# Patient Record
Sex: Female | Born: 1995
Health system: Southern US, Community
[De-identification: ages and names within clinical notes are randomized; demographics above are authoritative.]

## PROBLEM LIST (undated history)

## (undated) DIAGNOSIS — F419 Anxiety disorder, unspecified: Secondary | ICD-10-CM

## (undated) DIAGNOSIS — F32A Depression, unspecified: Secondary | ICD-10-CM

---
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
## 2016-01-28 ENCOUNTER — Encounter (HOSPITAL_COMMUNITY): Payer: Self-pay

## 2016-01-28 ENCOUNTER — Ambulatory Visit (HOSPITAL_COMMUNITY)
Admission: EM | Admit: 2016-01-28 | Discharge: 2016-01-28 | Disposition: A | Discharge status: Home / Self Care | Payer: No Typology Code available for payment source | Attending: Emergency Medicine | Admitting: Emergency Medicine

## 2016-01-28 DIAGNOSIS — L309 Dermatitis, unspecified: Secondary | ICD-10-CM

## 2016-01-28 MED ORDER — TRIAMCINOLONE ACETONIDE 0.1 % EX OINT: 1.0000 "application " | TOPICAL_OINTMENT | Freq: Two times a day (BID) | CUTANEOUS | Status: DC

## 2016-01-28 NOTE — ED Notes (Signed)
Patient presents with a rash on bilateral underarms and right corner of eye x1 month, pt has used Cortizone cream she says it cleared a little but has returned and she would like to have it examined  No acute distress

## 2016-01-28 NOTE — Discharge Instructions (Signed)
The rash is eczema. Use the triamcinolone ointment twice a day until it is resolved. Also apply the Aveeno lotion you bought at least twice a day.  For the spot by your eye, only use the lotion frequently. You can apply a tiny amount of over-the-counter hydrocortisone cream, but did not use anything stronger.  Follow-up as needed.

## 2016-01-28 NOTE — ED Provider Notes (Signed)
CSN: [id number]     Arrival date & time 01/28/16  1619 History   First MD Initiated Contact with Patient 01/28/16 1634     Chief Complaint  Patient presents with  . Rash   (Consider location/radiation/quality/duration/timing/severity/associated sxs/prior Treatment) HPI She is an 20 year old woman here for evaluation of rash. She states the rash is in her bilateral axilla. Is been present for about a month. It is dry and itchy. She has tried over-the-counter hydrocortisone cream which did provide some improvement. She also reports getting a spot by her right eye. She did just buy some Aveeno lotion.  History reviewed. No pertinent past medical history. History reviewed. No pertinent past surgical history. Family History  Problem Relation Age of Onset  . Diabetes Father   . Hypertension Father   . Hypertension Maternal Aunt   . Cancer Maternal Aunt   . Hypertension Maternal Uncle   . Diabetes Paternal Aunt   . Diabetes Paternal Uncle   . Hypertension Maternal Grandmother   . Cancer Maternal Grandmother   . Hypertension Paternal Grandmother   . Heart attack Neg Hx   . Hyperlipidemia Neg Hx   . Sudden death Neg Hx   . Asthma Mother    Social History  Substance Use Topics  . Smoking status: Never Smoker   . Smokeless tobacco: Never Used  . Alcohol Use: No   OB History    No data available     Review of Systems As in history of present illness Allergies  Review of patient's allergies indicates no known allergies.  Home Medications   Prior to Admission medications   Medication Sig Start Date End Date Taking? Authorizing Provider  triamcinolone ointment (KENALOG) 0.1 % Apply 1 application topically 2 (two) times daily. 01/28/16   Charm RingsErin J Ashling Roane, MD   Meds Ordered and Administered this Visit  Medications - No data to display  BP 105/71 mmHg  Pulse 81  Temp(Src) 98 F (36.7 C) (Oral)  Resp 14  SpO2 100%  LMP 01/03/2016 (Approximate) No data found.   Physical Exam   Constitutional: She is oriented to person, place, and time. She appears well-developed and well-nourished. No distress.  Cardiovascular: Normal rate.   Pulmonary/Chest: Effort normal.  Neurological: She is alert and oriented to person, place, and time.  Skin: Rash (she has a hyperpigmented scaly rash in the anterior axilla bilaterally. She also has some slight hyperpigmentation and thickening of the skin the inner right eye.) noted.    ED Course  Procedures (including critical care time)  Labs Review Labs Reviewed - No data to display  Imaging Review No results found.    MDM   1. Eczema    No central clearing to suggest fungal infection. Triamcinolone ointment for body lesions. Discussed avoiding steroid creams on the face due to skin sensitivity. Recommended frequent application of Aveeno lotion. Follow-up as needed.    Charm RingsErin J Angela Platner, MD 01/28/16 [phone]

## 2016-02-11 ENCOUNTER — Ambulatory Visit (INDEPENDENT_AMBULATORY_CARE_PROVIDER_SITE_OTHER): Payer: No Typology Code available for payment source | Admitting: *Deleted

## 2016-02-11 DIAGNOSIS — Z3042 Encounter for surveillance of injectable contraceptive: Secondary | ICD-10-CM | POA: Diagnosis not present

## 2016-02-11 MED ORDER — MEDROXYPROGESTERONE ACETATE 150 MG/ML IM SUSP
150.0000 mg | Freq: Once | INTRAMUSCULAR | Status: AC
  Administered 2016-02-11: 150 mg via INTRAMUSCULAR

## 2016-02-11 NOTE — Progress Notes (Signed)
Pt presents for depo injection. Pt within depo window, no urine hcg needed. Injection given, tolerated well. F/u depo injection visit scheduled.   

## 2016-03-22 ENCOUNTER — Encounter: Payer: Self-pay | Admitting: Pediatrics

## 2016-03-23 ENCOUNTER — Encounter: Payer: Self-pay | Admitting: Pediatrics

## 2016-04-28 ENCOUNTER — Ambulatory Visit (INDEPENDENT_AMBULATORY_CARE_PROVIDER_SITE_OTHER): Payer: No Typology Code available for payment source | Admitting: *Deleted

## 2016-04-28 DIAGNOSIS — Z3042 Encounter for surveillance of injectable contraceptive: Secondary | ICD-10-CM

## 2016-04-28 MED ORDER — MEDROXYPROGESTERONE ACETATE 150 MG/ML IM SUSP
150.0000 mg | Freq: Once | INTRAMUSCULAR | Status: AC
  Administered 2016-04-28: 150 mg via INTRAMUSCULAR

## 2016-04-28 NOTE — Progress Notes (Signed)
Pt presents for depo injection. Pt within depo window, no urine hcg needed. Injection given, tolerated well. F/u depo injection visit scheduled.   

## 2016-07-14 ENCOUNTER — Ambulatory Visit (INDEPENDENT_AMBULATORY_CARE_PROVIDER_SITE_OTHER): Payer: Medicaid Other | Admitting: *Deleted

## 2016-07-14 DIAGNOSIS — Z3049 Encounter for surveillance of other contraceptives: Secondary | ICD-10-CM

## 2016-07-14 DIAGNOSIS — Z3042 Encounter for surveillance of injectable contraceptive: Secondary | ICD-10-CM

## 2016-07-14 MED ORDER — MEDROXYPROGESTERONE ACETATE 150 MG/ML IM SUSP
150.0000 mg | Freq: Once | INTRAMUSCULAR | Status: AC
  Administered 2016-07-14: 150 mg via INTRAMUSCULAR

## 2016-07-14 NOTE — Progress Notes (Signed)
Pt presents for depo injection. Pt within depo window, no urine hcg needed. Injection given, tolerated well. F/u depo injection visit scheduled.   

## 2016-09-29 ENCOUNTER — Ambulatory Visit (INDEPENDENT_AMBULATORY_CARE_PROVIDER_SITE_OTHER): Payer: Medicaid Other | Admitting: *Deleted

## 2016-09-29 DIAGNOSIS — Z3042 Encounter for surveillance of injectable contraceptive: Secondary | ICD-10-CM | POA: Diagnosis not present

## 2016-09-29 MED ORDER — MEDROXYPROGESTERONE ACETATE 150 MG/ML IM SUSP
150.0000 mg | Freq: Once | INTRAMUSCULAR | Status: AC
  Administered 2016-09-29: 150 mg via INTRAMUSCULAR

## 2016-09-29 NOTE — Progress Notes (Addendum)
Pt presents for depo injection. Pt within depo window, no urine hcg needed. Injection given, tolerated well. F/u depo injection visit scheduled.   

## 2016-12-15 ENCOUNTER — Ambulatory Visit (INDEPENDENT_AMBULATORY_CARE_PROVIDER_SITE_OTHER): Payer: Medicaid Other | Admitting: *Deleted

## 2016-12-15 DIAGNOSIS — Z3042 Encounter for surveillance of injectable contraceptive: Secondary | ICD-10-CM

## 2016-12-15 MED ORDER — MEDROXYPROGESTERONE ACETATE 150 MG/ML IM SUSP
150.0000 mg | Freq: Once | INTRAMUSCULAR | Status: AC
  Administered 2016-12-15: 150 mg via INTRAMUSCULAR

## 2016-12-15 NOTE — Progress Notes (Signed)
Pt presents for depo injection. Pt within depo window, no urine hcg needed. Injection given, tolerated well. F/u depo injection visit sched.

## 2017-03-02 ENCOUNTER — Ambulatory Visit: Payer: Self-pay

## 2017-03-03 ENCOUNTER — Ambulatory Visit (HOSPITAL_COMMUNITY)
Admission: EM | Admit: 2017-03-03 | Discharge: 2017-03-03 | Disposition: A | Discharge status: Home / Self Care | Payer: BLUE CROSS/BLUE SHIELD | Attending: Family Medicine | Admitting: Family Medicine

## 2017-03-03 ENCOUNTER — Encounter (HOSPITAL_COMMUNITY): Payer: Self-pay | Admitting: Emergency Medicine

## 2017-03-03 DIAGNOSIS — R197 Diarrhea, unspecified: Secondary | ICD-10-CM | POA: Diagnosis not present

## 2017-03-03 DIAGNOSIS — R110 Nausea: Secondary | ICD-10-CM | POA: Diagnosis not present

## 2017-03-03 NOTE — Discharge Instructions (Signed)
No dairy products for the next few days. Follow the instructions given to you in reference to the symptoms. It appears that you are gradually getting better. Continue drinking the clear liquids to increase her strength. Avoid dairy products. For worsening, new symptoms or problems may return.

## 2017-03-03 NOTE — ED Triage Notes (Signed)
Onset Thursday night of diarrhea.  Denies vomiting at that time.  Diarrhea has continued.  Patient is not having that many stools, just that they are watery

## 2017-03-03 NOTE — ED Provider Notes (Signed)
CSN: [id number]     Arrival date & time 03/03/17  1706 History   First MD Initiated Contact with Patient 03/03/17 1854     Chief Complaint  Patient presents with  . Abdominal Pain   (Consider location/radiation/quality/duration/timing/severity/associated sxs/prior Treatment) 21 year old female accompanied by mother stating that 2 days ago she started feeling sickly with nausea and diarrhea. Yesterday she was feeling worse with several episodes of watery diarrhea, no blood and a "stomachache" across the mid abdomen and periumbilical area. She was feeling better yesterday afternoon with decrease and number of diarrhea stools. She able cereal with milk and she had another episode of diarrhea several hours later. She states she is feeling better now she is drinking Gatorade she has no vomiting. No abdominal pain at this time.      History reviewed. No pertinent past medical history. History reviewed. No pertinent surgical history. Family History  Problem Relation Age of Onset  . Diabetes Father   . Hypertension Father   . Asthma Mother   . Hypertension Maternal Aunt   . Cancer Maternal Aunt   . Hypertension Maternal Uncle   . Diabetes Paternal Aunt   . Diabetes Paternal Uncle   . Hypertension Maternal Grandmother   . Cancer Maternal Grandmother   . Hypertension Paternal Grandmother   . Heart attack Neg Hx   . Hyperlipidemia Neg Hx   . Sudden death Neg Hx    Social History  Substance Use Topics  . Smoking status: Never Smoker  . Smokeless tobacco: Never Used  . Alcohol use No   OB History    No data available     Review of Systems  Constitutional: Negative.   Respiratory: Negative.   Gastrointestinal: Positive for abdominal pain, diarrhea and nausea. Negative for abdominal distention, blood in stool, constipation and vomiting.  Genitourinary: Negative.   Skin: Negative.   Neurological: Negative.   All other systems reviewed and are negative.   Allergies  Patient has no  known allergies.  Home Medications   Prior to Admission medications   Medication Sig Start Date End Date Taking? Authorizing Provider  triamcinolone ointment (KENALOG) 0.1 % Apply 1 application topically 2 (two) times daily. 01/28/16   Charm RingsHonig, Erin J, MD   Meds Ordered and Administered this Visit  Medications - No data to display  BP 96/60 (BP Location: Right Arm)   Pulse 100   Temp 98.4 F (36.9 C) (Oral)   Resp 16   SpO2 100%  No data found.   Physical Exam  Constitutional: She is oriented to person, place, and time. She appears well-developed and well-nourished. No distress.  HENT:  Head: Normocephalic and atraumatic.  Neck: Neck supple.  Cardiovascular: Normal rate, regular rhythm and normal heart sounds.   Pulmonary/Chest: Breath sounds normal. No respiratory distress.  Abdominal: Soft. Bowel sounds are normal. She exhibits no distension and no mass. There is no tenderness. There is no rebound and no guarding.  Percussion is tympanic and on all quadrants. No distention, abdomen flat. No palpable masses.  Musculoskeletal: She exhibits no edema.  Neurological: She is alert and oriented to person, place, and time.  Skin: Skin is warm and dry.  Psychiatric: She has a normal mood and affect.  Nursing note and vitals reviewed.   Urgent Care Course     Procedures (including critical care time)  Labs Review Labs Reviewed - No data to display  Imaging Review No results found.   Visual Acuity Review  Right Eye Distance:  Left Eye Distance:   Bilateral Distance:    Right Eye Near:   Left Eye Near:    Bilateral Near:         MDM   1. Diarrhea, unspecified type   2. Nausea without vomiting    No dairy products for the next few days. Follow the instructions given to you in reference to the symptoms. It appears that you are gradually getting better. Continue drinking the clear liquids to increase her strength. Avoid dairy products. For worsening, new symptoms or  problems may return.     Hayden Rasmussen, NP 03/03/17 1914

## 2017-04-06 ENCOUNTER — Encounter: Payer: Self-pay | Admitting: Family

## 2017-04-06 ENCOUNTER — Ambulatory Visit (INDEPENDENT_AMBULATORY_CARE_PROVIDER_SITE_OTHER): Payer: Medicaid Other | Admitting: Family

## 2017-04-06 VITALS — BP 91/56 | HR 79 | Ht 60.24 in | Wt 101.8 lb

## 2017-04-06 DIAGNOSIS — Z3042 Encounter for surveillance of injectable contraceptive: Secondary | ICD-10-CM

## 2017-04-06 DIAGNOSIS — Z113 Encounter for screening for infections with a predominantly sexual mode of transmission: Secondary | ICD-10-CM | POA: Diagnosis not present

## 2017-04-06 DIAGNOSIS — Z3202 Encounter for pregnancy test, result negative: Secondary | ICD-10-CM

## 2017-04-06 LAB — POCT RAPID HIV: Rapid HIV, POC: NEGATIVE

## 2017-04-06 LAB — POCT URINE PREGNANCY: Preg Test, Ur: NEGATIVE

## 2017-04-06 MED ORDER — MEDROXYPROGESTERONE ACETATE 150 MG/ML IM SUSP
150.0000 mg | Freq: Once | INTRAMUSCULAR | Status: AC
  Administered 2017-04-06: 150 mg via INTRAMUSCULAR

## 2017-04-06 NOTE — Progress Notes (Signed)
THIS RECORD MAY CONTAIN CONFIDENTIAL INFORMATION THAT SHOULD NOT BE RELEASED WITHOUT REVIEW OF THE SERVICE PROVIDER.  Adolescent Medicine Consultation Follow-Up Visit Tamara Rodriguez  is a 21 y.o. female referred by No ref. provider found here today for follow-up regarding desire for Depo - outside of window.    Last seen in [facility] on 11/30/15 for chlamydia tx; depo as contraception.  Pertinent Labs? No Growth Chart Viewed? no   History was provided by the patient.  Interpreter? no  PCP Confirmed?  no  My Chart Activated?   no    Chief Complaint  Patient presents with  . Follow-up  . Contraception    HPI:    Was supposed to have depo in July.  Using condoms. Last unprotected sex over a year ago.  No vaginal discharge, lesions, pelvic pain, no painful intercourse.  Denies contraindications for progesterone or estrogen.  Reviewed [facility] options, patient desires to continue with Depo.   Review of Systems  Constitutional: Negative for malaise/fatigue.  Eyes: Negative for double vision.  Respiratory: Negative for shortness of breath.   Cardiovascular: Negative for chest pain and palpitations.  Gastrointestinal: Negative for abdominal pain, constipation, diarrhea, nausea and vomiting.  Genitourinary: Negative for dysuria.  Musculoskeletal: Negative for joint pain and myalgias.  Skin: Negative for rash.  Neurological: Negative for dizziness and headaches.  Endo/Heme/Allergies: Does not bruise/bleed easily.   Patient's last menstrual period was 04/06/2017. No Known Allergies Outpatient Medications Prior to Visit  Medication Sig Dispense Refill  . triamcinolone ointment (KENALOG) 0.1 % Apply 1 application topically 2 (two) times daily. (Patient not taking: Reported on 04/06/2017) 30 g 0   No facility-administered medications prior to visit.      Patient Active Problem List   Diagnosis Date Noted  . Chlamydia 11/29/2015  . Irregular menses 11/26/2015  .  Encounter for Depo-Provera contraception 11/26/2015  . Right ankle sprain 05/16/2011     Confidentiality was discussed with the patient and if applicable, with caregiver as well. The following portions of the patient's history were reviewed and updated as appropriate: allergies, current medications, past medical history and problem list.  Physical Exam:  Vitals:   04/06/17 1510  BP: (!) 91/56  Pulse: 79  Weight: 101 lb 12.8 oz (46.2 kg)  Height: 5' 0.24" (1.53 m)   BP (!) 91/56 (BP Location: Right Arm, Patient Position: Sitting, Cuff Size: Normal)   Pulse 79   Ht 5' 0.24" (1.53 m)   Wt 101 lb 12.8 oz (46.2 kg)   LMP 04/06/2017   BMI 19.73 kg/m  Body mass index: body mass index is 19.73 kg/m. Blood pressure percentiles are <1 % systolic and 15 % diastolic based on the August 2017 AAP Clinical Practice Guideline. Blood pressure percentile targets: 90: 124/76, 95: 128/79, 95 + 12 mmHg: 140/91.  Wt Readings from Last 3 Encounters:  04/06/17 101 lb 12.8 oz (46.2 kg) (5 %, Z= -1.66)*  12/10/15 87 lb 12.8 oz (39.8 kg) (<1 %, Z= -3.06)*  11/26/15 88 lb 3.2 oz (40 kg) (<1 %, Z= -3.00)*   * Growth percentiles are based on CDC 2-20 Years data.    Physical Exam  Constitutional: She is oriented to person, place, and time. She appears well-developed. No distress.  HENT:  Head: Normocephalic and atraumatic.  Eyes: Pupils are equal, round, and reactive to light. EOM are normal. No scleral icterus.  Neck: Normal range of motion. Neck supple. No thyromegaly present.  Cardiovascular: Normal rate, regular rhythm, normal  heart sounds and intact distal pulses.   No murmur heard. Pulmonary/Chest: Effort normal and breath sounds normal.  Abdominal: Soft.  Musculoskeletal: Normal range of motion. She exhibits no edema.  Lymphadenopathy:    She has no cervical adenopathy.  Neurological: She is alert and oriented to person, place, and time. No cranial nerve deficit.  Skin: Skin is warm and dry.  No rash noted.  Psychiatric: She has a normal mood and affect. Her behavior is normal. Judgment and thought content normal.    Assessment/Plan: 1. Encounter for Depo-Provera contraception -continue with depo after reviewing options Tier 1 and Tier 2, including nexplanon, iud, pill, patch and ring -condom use reviewed -return precautions  2. Routine screening for STI (sexually transmitted infection) -per protocol  - GC/Chlamydia Probe Amp - POCT Rapid HIV  3. Pregnancy examination or test, negative result Negative  - POCT urine pregnancy   Follow-up:  Return in about 3 months (around 07/07/2017), or in depo window, for RN - DEPO VISIT.   Medical decision-making:  >15 minutes spent face to face with patient with more than 50% of appointment spent discussing diagnosis, management, follow-up, and reviewing of contraceptive options, emergency contraception, condom use.

## 2017-04-07 LAB — GC/CHLAMYDIA PROBE AMP
CT PROBE, AMP APTIMA: DETECTED — AB
GC Probe RNA: NOT DETECTED

## 2017-04-10 ENCOUNTER — Encounter: Payer: Self-pay | Admitting: Pediatrics

## 2017-04-10 ENCOUNTER — Ambulatory Visit (INDEPENDENT_AMBULATORY_CARE_PROVIDER_SITE_OTHER): Payer: Medicaid Other | Admitting: Pediatrics

## 2017-04-10 VITALS — BP 93/58 | HR 82 | Ht 60.63 in | Wt 100.4 lb

## 2017-04-10 DIAGNOSIS — A749 Chlamydial infection, unspecified: Secondary | ICD-10-CM | POA: Diagnosis not present

## 2017-04-10 MED ORDER — AZITHROMYCIN 500 MG PO TABS
1000.0000 mg | ORAL_TABLET | Freq: Once | ORAL | Status: AC
Start: 2017-04-10 — End: 2017-04-10
  Administered 2017-04-10: 1000 mg via ORAL

## 2017-04-10 NOTE — Patient Instructions (Signed)
You may have Chlamydia even if you feel fine and have no symptoms.  What is Chlamydia?  Chlamydia is a sexually transmitted disease that can cause a bad infection in the female organs. The infection can cause fever, discharge and pain. It can also cause future tubal pregnancy or sterility in women. Men can develop pain, discharge, or more severe infections in the testes or scrotum.  It is recommended that you have an exam to diagnose Chlamydia and other possible STDs.  [facility].  Tell them you were told by a partner to call.  There is no charge for the exam and treatment.  FREE SCREENING FOR [place] RESIDENTS.  You may also take the treatment your partner gave you if you cannot come into the clinic.  It is possible that you have this infection and do not have symptoms. It is important that you be treated in order to prevent complications and to prevent spreading this infection to others.  Do not have sex for 7 days after treatment. It takes that long for the medication to work.  Tell all sex partners you have been with in the last 2 months to get checked for Chlamydia TREATMENT: Azithromycin 1 gram. Take the 2 pills at one time.  Do not share this medicine: the whole dose is needed to cure Chlamydia.  Do not take this medicine if you have been allergic to any antibiotic in the past.  Call the clinic if you have questions.  Do not take this medicine if you are having pain in your abdomen, pelvic area, groin area, or your testes - it may not work. Come to the clinic for a check-up to be sure you get the right treatment.  Possible side effects:  The antibiotic for your treatment has been carefully chosen to be safe and effective. However, any medication can have side effects. The most common side effect is upset stomach. Sometimes there is stomach cramping or diarrhea. Very rarely is there a rash, fever, or breathing problems related  to the medicine. If you have any of these symptoms you should call the clinic at [phone].  If your symptoms are severe - especially if it is hard to breathe - or it is after clinic hours, call 911.  DO NOT HAVE ANY KIND OF SEX (ORAL, ANAL, or VAGINAL) WITH OR WITHOUT CONDOMS FOR 7 DAYS after you take this medicine. After 7 days, use condoms. Condoms help protect against gonorrhea and other STDs.  Men and women who have Chlamydia should be re-tested in 3 to 4 months after treatment to be sure they have not been re-infected.

## 2017-04-10 NOTE — Progress Notes (Signed)
History was provided by the patient.  Tamara Rodriguez is a 21 y.o. female who is here for chlamydia treatment.   PCP confirmed? No.- needs to establish with adult care   Patient, No Pcp Per  HPI:  Has some vaginal discharge that is white but no other symptoms. She is not sexually active right now. She was last sexually active about a week ago with a new partner. They used a condom. She is willing to let him know but does not anticipate being with him again. No pain with sex.   Review of Systems  Constitutional: Negative for malaise/fatigue.  Eyes: Negative for double vision.  Respiratory: Negative for shortness of breath.   Cardiovascular: Negative for chest pain and palpitations.  Gastrointestinal: Negative for abdominal pain, constipation, diarrhea, nausea and vomiting.  Genitourinary: Negative for dysuria.  Musculoskeletal: Negative for joint pain and myalgias.  Skin: Negative for rash.  Neurological: Negative for dizziness and headaches.  Endo/Heme/Allergies: Does not bruise/bleed easily.     Patient Active Problem List   Diagnosis Date Noted  . Chlamydia 11/29/2015  . Irregular menses 11/26/2015  . Encounter for Depo-Provera contraception 11/26/2015  . Right ankle sprain 05/16/2011    Current Outpatient Prescriptions on File Prior to Visit  Medication Sig Dispense Refill  . triamcinolone ointment (KENALOG) 0.1 % Apply 1 application topically 2 (two) times daily. 30 g 0   No current facility-administered medications on file prior to visit.     No Known Allergies  Physical Exam:    Vitals:   04/10/17 0950  BP: (!) 93/58  Pulse: 82  Weight: 100 lb 6.4 oz (45.5 kg)  Height: 5' 0.63" (1.54 m)    Blood pressure percentiles are 2.1 % systolic and 23.4 % diastolic based on the August 2017 AAP Clinical Practice Guideline. Patient's last menstrual period was 04/06/2017.  Physical Exam  Constitutional: She appears well-developed. No distress.  HENT:  Mouth/Throat:  Oropharynx is clear and moist.  Neck: No thyromegaly present.  Cardiovascular: Normal rate and regular rhythm.   No murmur heard. Pulmonary/Chest: Breath sounds normal.  Abdominal: Soft. She exhibits no mass. There is tenderness in the right lower quadrant, suprapubic area and left lower quadrant. There is no guarding.  Musculoskeletal: She exhibits no edema.  Lymphadenopathy:    She has no cervical adenopathy.  Neurological: She is alert.  Skin: Skin is warm. No rash noted.  Psychiatric: She has a normal mood and affect.  Nursing note and vitals reviewed.    Assessment/Plan: 1. Chlamydia Treated today in clinic. Denied needing EPT as she doesn't expect to see that partner again. Discussed abstinence for 7 days and repeat of gc/ct wen she comes for depo shot in October. She was in agreement. Discussed return precautions.  - azithromycin (ZITHROMAX) tablet 1,000 mg; Take 2 tablets (1,000 mg total) by mouth once.

## 2017-04-23 ENCOUNTER — Ambulatory Visit: Payer: Medicaid Other | Admitting: Pediatrics

## 2017-06-22 ENCOUNTER — Ambulatory Visit (INDEPENDENT_AMBULATORY_CARE_PROVIDER_SITE_OTHER): Payer: Medicaid Other

## 2017-06-22 DIAGNOSIS — Z3042 Encounter for surveillance of injectable contraceptive: Secondary | ICD-10-CM | POA: Diagnosis not present

## 2017-06-22 DIAGNOSIS — Z113 Encounter for screening for infections with a predominantly sexual mode of transmission: Secondary | ICD-10-CM | POA: Diagnosis not present

## 2017-06-22 MED ORDER — MEDROXYPROGESTERONE ACETATE 150 MG/ML IM SUSP
150.0000 mg | Freq: Once | INTRAMUSCULAR | Status: AC
  Administered 2017-06-22: 150 mg via INTRAMUSCULAR

## 2017-06-22 NOTE — Progress Notes (Signed)
Last lmp 06/06/2017 Here for depo within window.  Tolerated well. Next appointment made.

## 2017-06-23 LAB — C. TRACHOMATIS/N. GONORRHOEAE RNA
C. TRACHOMATIS RNA, TMA: NOT DETECTED
N. gonorrhoeae RNA, TMA: NOT DETECTED

## 2017-09-07 ENCOUNTER — Ambulatory Visit (INDEPENDENT_AMBULATORY_CARE_PROVIDER_SITE_OTHER): Payer: BLUE CROSS/BLUE SHIELD

## 2017-09-07 DIAGNOSIS — Z113 Encounter for screening for infections with a predominantly sexual mode of transmission: Secondary | ICD-10-CM

## 2017-09-07 DIAGNOSIS — Z3042 Encounter for surveillance of injectable contraceptive: Secondary | ICD-10-CM | POA: Diagnosis not present

## 2017-09-07 MED ORDER — MEDROXYPROGESTERONE ACETATE 150 MG/ML IM SUSP
150.0000 mg | Freq: Once | INTRAMUSCULAR | Status: AC
  Administered 2017-09-07: 150 mg via INTRAMUSCULAR

## 2017-09-07 NOTE — Progress Notes (Signed)
Pt presents for depo injection. Pt within depo window, no urine hcg needed. Injection given, tolerated well. F/u depo injection visit scheduled. Due to patient hx of Chlamydia will re-screen today for STI. Pt aware. Correct phone number listed in Epic.

## 2017-09-11 ENCOUNTER — Ambulatory Visit (INDEPENDENT_AMBULATORY_CARE_PROVIDER_SITE_OTHER): Payer: BLUE CROSS/BLUE SHIELD | Admitting: Pediatrics

## 2017-09-11 VITALS — BP 88/58 | HR 82 | Ht 60.63 in | Wt 98.4 lb

## 2017-09-11 DIAGNOSIS — A749 Chlamydial infection, unspecified: Secondary | ICD-10-CM

## 2017-09-11 DIAGNOSIS — Z113 Encounter for screening for infections with a predominantly sexual mode of transmission: Secondary | ICD-10-CM | POA: Diagnosis not present

## 2017-09-11 LAB — C. TRACHOMATIS/N. GONORRHOEAE RNA
C. TRACHOMATIS RNA, TMA: DETECTED — AB
N. GONORRHOEAE RNA, TMA: NOT DETECTED

## 2017-09-11 MED ORDER — AZITHROMYCIN 500 MG PO TABS
1000.0000 mg | ORAL_TABLET | Freq: Once | ORAL | Status: AC
  Administered 2017-09-11: 1000 mg via ORAL

## 2017-09-11 NOTE — Progress Notes (Signed)
History was provided by the patient.  Tamara Rodriguez is a 22 y.o. female who is here for chlamydia treatment.   PCP confirmed? Yes.    Patient, No Pcp Per  HPI:  Here for chlamydia treatment. Would like partner therapy as well. Thinks that her partner did not get treated last time and thus this is why she has it again. Been with this same partner since October.   Having some excess vaginal discharge that started a few weeks ago. Some itching and burning. No pain with sex.   Review of Systems  Constitutional: Negative for malaise/fatigue.  Eyes: Negative for double vision.  Respiratory: Negative for shortness of breath.   Cardiovascular: Negative for chest pain and palpitations.  Gastrointestinal: Negative for abdominal pain, constipation, diarrhea, nausea and vomiting.  Genitourinary: Negative for dysuria.  Musculoskeletal: Negative for joint pain and myalgias.  Skin: Negative for rash.  Neurological: Negative for dizziness and headaches.  Endo/Heme/Allergies: Does not bruise/bleed easily.     Patient Active Problem List   Diagnosis Date Noted  . Chlamydia 11/29/2015  . Irregular menses 11/26/2015  . Encounter for Depo-Provera contraception 11/26/2015  . Right ankle sprain 05/16/2011    No current outpatient medications on file prior to visit.   No current facility-administered medications on file prior to visit.     No Known Allergies  Physical Exam:    Vitals:   09/11/17 1517  BP: (!) 88/58  Pulse: 82  Weight: 98 lb 6.4 oz (44.6 kg)  Height: 5' 0.63" (1.54 m)    Growth percentile SmartLinks can only be used for patients less than 21 years old. No LMP recorded. Patient has had an injection.  Physical Exam  Constitutional: She appears well-developed. No distress.  HENT:  Mouth/Throat: Oropharynx is clear and moist.  Neck: No thyromegaly present.  Cardiovascular: Normal rate and regular rhythm.  No murmur heard. Pulmonary/Chest: Breath sounds normal.   Abdominal: Soft. She exhibits no mass. There is no tenderness. There is no guarding.  Musculoskeletal: She exhibits no edema.  Lymphadenopathy:    She has no cervical adenopathy.  Neurological: She is alert.  Skin: Skin is warm. No rash noted.  Psychiatric: She has a normal mood and affect.  Nursing note and vitals reviewed.    Assessment/Plan: 1. Chlamydia Treated in clinic today. EPT provided to patient and explained. No sex for 7 days. No sx suggestive of PID.  - azithromycin (ZITHROMAX) tablet 1,000 mg  2. Routine screening for STI (sexually transmitted infection) Discussed rising prevalence of syphilis and recommended screening given chlamydia exposure. She agreed.  - RPR

## 2017-09-11 NOTE — Patient Instructions (Signed)
PARTNER FACT SHEET FOR CHLAMYDIA TRACHOMATIS  Patient-Delivered Partner Treatment for Chlamydia  You have been exposed to Chlamydia.  You may have Chlamydia even if you feel fine and have no symptoms.  What is Chlamydia?  Chlamydia is a sexually transmitted disease that can cause a bad infection in the female organs. The infection can cause fever, discharge and pain. It can also cause future tubal pregnancy or sterility in women. Men can develop pain, discharge, or more severe infections in the testes or scrotum.  It is recommended that you have an exam to diagnose Chlamydia and other possible STDs.  [facility] for [facility].  Tell them you were told by a partner to call.  There is no charge for the exam and treatment.  FREE SCREENING FOR [place] RESIDENTS.  You may also take the treatment your partner gave you if you cannot come into the clinic.  It is possible that you have this infection and do not have symptoms. It is important that you be treated in order to prevent complications and to prevent spreading this infection to others.  Do not have sex for 7 days after treatment. It takes that long for the medication to work.  Tell all sex partners you have been with in the last 2 months to get checked for Chlamydia TREATMENT: Azithromycin 1 gram. Take the 2 pills at one time.  Do not share this medicine: the whole dose is needed to cure Chlamydia.  Do not take this medicine if you have been allergic to any antibiotic in the past.  Call the clinic if you have questions.  Do not take this medicine if you are having pain in your abdomen, pelvic area, groin area, or your testes - it may not work. Come to the clinic for a check-up to be sure you get the right treatment.  Possible side effects:  The antibiotic for your treatment has been carefully chosen to be safe and effective. However, any medication can have side effects. The most common side  effect is upset stomach. Sometimes there is stomach cramping or diarrhea. Very rarely is there a rash, fever, or breathing problems related to the medicine. If you have any of these symptoms you should call the clinic at [phone].  If your symptoms are severe - especially if it is hard to breathe - or it is after clinic hours, call 911.  DO NOT HAVE ANY KIND OF SEX (ORAL, ANAL, or VAGINAL) WITH OR WITHOUT CONDOMS FOR 7 DAYS after you take this medicine. After 7 days, use condoms. Condoms help protect against gonorrhea and other STDs.  Men and women who have Chlamydia should be re-tested in 3 to 4 months after treatment to be sure they have not been re-infected.

## 2017-09-12 LAB — RPR: RPR: NONREACTIVE

## 2017-11-23 ENCOUNTER — Ambulatory Visit: Payer: Self-pay

## 2017-11-28 ENCOUNTER — Ambulatory Visit (INDEPENDENT_AMBULATORY_CARE_PROVIDER_SITE_OTHER): Payer: 59

## 2017-11-28 DIAGNOSIS — Z3042 Encounter for surveillance of injectable contraceptive: Secondary | ICD-10-CM

## 2017-11-28 DIAGNOSIS — R69 Illness, unspecified: Secondary | ICD-10-CM | POA: Diagnosis not present

## 2017-11-28 DIAGNOSIS — Z113 Encounter for screening for infections with a predominantly sexual mode of transmission: Secondary | ICD-10-CM

## 2017-11-28 MED ORDER — MEDROXYPROGESTERONE ACETATE 150 MG/ML IM SUSP
150.0000 mg | Freq: Once | INTRAMUSCULAR | Status: AC
  Administered 2017-11-28: 150 mg via INTRAMUSCULAR

## 2017-11-28 NOTE — Progress Notes (Signed)
Pt presents for depo injection. Pt within depo window, no urine hcg needed. Injection given, tolerated well. F/u depo injection visit scheduled. Testing for Gc/Chl as well considering recent positive 

## 2017-11-29 LAB — C. TRACHOMATIS/N. GONORRHOEAE RNA
C. TRACHOMATIS RNA, TMA: NOT DETECTED
N. gonorrhoeae RNA, TMA: NOT DETECTED

## 2018-02-13 ENCOUNTER — Ambulatory Visit: Payer: 59

## 2018-03-18 ENCOUNTER — Emergency Department (HOSPITAL_COMMUNITY)
Admission: EM | Admit: 2018-03-18 | Discharge: 2018-03-18 | Disposition: A | Discharge status: Other Acute Inpatient Hospital | Payer: 59 | Attending: Emergency Medicine | Admitting: Emergency Medicine

## 2018-03-18 ENCOUNTER — Other Ambulatory Visit: Payer: Self-pay

## 2018-03-18 ENCOUNTER — Encounter (HOSPITAL_COMMUNITY): Payer: Self-pay | Admitting: *Deleted

## 2018-03-18 ENCOUNTER — Inpatient Hospital Stay (HOSPITAL_COMMUNITY)
Admission: AD | Admit: 2018-03-18 | Discharge: 2018-03-21 | DRG: 885 | Disposition: A | Discharge status: Home / Self Care | Payer: 59 | Source: Ambulatory Visit | Attending: Psychiatry | Admitting: Psychiatry

## 2018-03-18 DIAGNOSIS — Z811 Family history of alcohol abuse and dependence: Secondary | ICD-10-CM

## 2018-03-18 DIAGNOSIS — Z62820 Parent-biological child conflict: Secondary | ICD-10-CM | POA: Diagnosis present

## 2018-03-18 DIAGNOSIS — F329 Major depressive disorder, single episode, unspecified: Secondary | ICD-10-CM | POA: Diagnosis not present

## 2018-03-18 DIAGNOSIS — G4700 Insomnia, unspecified: Secondary | ICD-10-CM | POA: Diagnosis not present

## 2018-03-18 DIAGNOSIS — F419 Anxiety disorder, unspecified: Secondary | ICD-10-CM | POA: Diagnosis present

## 2018-03-18 DIAGNOSIS — F331 Major depressive disorder, recurrent, moderate: Principal | ICD-10-CM | POA: Diagnosis present

## 2018-03-18 DIAGNOSIS — R45851 Suicidal ideations: Secondary | ICD-10-CM | POA: Diagnosis present

## 2018-03-18 DIAGNOSIS — F32A Depression, unspecified: Secondary | ICD-10-CM | POA: Diagnosis present

## 2018-03-18 DIAGNOSIS — Z8619 Personal history of other infectious and parasitic diseases: Secondary | ICD-10-CM | POA: Diagnosis not present

## 2018-03-18 DIAGNOSIS — Z91040 Latex allergy status: Secondary | ICD-10-CM

## 2018-03-18 DIAGNOSIS — R69 Illness, unspecified: Secondary | ICD-10-CM | POA: Diagnosis not present

## 2018-03-18 DIAGNOSIS — Z818 Family history of other mental and behavioral disorders: Secondary | ICD-10-CM

## 2018-03-18 LAB — CBC WITH DIFFERENTIAL/PLATELET
Basophils Absolute: 0 10*3/uL (ref 0.0–0.1)
Basophils Relative: 1 %
Eosinophils Absolute: 0.2 10*3/uL (ref 0.0–0.7)
Eosinophils Relative: 4 %
HCT: 46.6 % — ABNORMAL HIGH (ref 36.0–46.0)
Hemoglobin: 15.5 g/dL — ABNORMAL HIGH (ref 12.0–15.0)
LYMPHS ABS: 1.9 10*3/uL (ref 0.7–4.0)
Lymphocytes Relative: 38 %
MCH: 30.3 pg (ref 26.0–34.0)
MCHC: 33.3 g/dL (ref 30.0–36.0)
MCV: 91.2 fL (ref 78.0–100.0)
Monocytes Absolute: 0.3 10*3/uL (ref 0.1–1.0)
Monocytes Relative: 6 %
Neutro Abs: 2.6 10*3/uL (ref 1.7–7.7)
Neutrophils Relative %: 51 %
PLATELETS: 252 10*3/uL (ref 150–400)
RBC: 5.11 MIL/uL (ref 3.87–5.11)
RDW: 13.3 % (ref 11.5–15.5)
WBC: 5 10*3/uL (ref 4.0–10.5)

## 2018-03-18 LAB — COMPREHENSIVE METABOLIC PANEL
ALBUMIN: 4.2 g/dL (ref 3.5–5.0)
ALK PHOS: 88 U/L (ref 38–126)
ALT: 15 U/L (ref 0–44)
ANION GAP: 8 (ref 5–15)
AST: 21 U/L (ref 15–41)
BILIRUBIN TOTAL: 0.9 mg/dL (ref 0.3–1.2)
BUN: 11 mg/dL (ref 6–20)
CALCIUM: 9.7 mg/dL (ref 8.9–10.3)
CO2: 26 mmol/L (ref 22–32)
Chloride: 106 mmol/L (ref 98–111)
Creatinine, Ser: 0.93 mg/dL (ref 0.44–1.00)
Glucose, Bld: 99 mg/dL (ref 70–99)
Potassium: 3.9 mmol/L (ref 3.5–5.1)
Sodium: 140 mmol/L (ref 135–145)
TOTAL PROTEIN: 7.9 g/dL (ref 6.5–8.1)

## 2018-03-18 LAB — RAPID URINE DRUG SCREEN, HOSP PERFORMED
Amphetamines: NOT DETECTED
BENZODIAZEPINES: NOT DETECTED
Barbiturates: NOT DETECTED
Cocaine: NOT DETECTED
OPIATES: NOT DETECTED
Tetrahydrocannabinol: NOT DETECTED

## 2018-03-18 LAB — I-STAT BETA HCG BLOOD, ED (MC, WL, AP ONLY): I-stat hCG, quantitative: <5 m[IU]/mL (ref <5)

## 2018-03-18 LAB — ETHANOL

## 2018-03-18 MED ORDER — MAGNESIUM HYDROXIDE 400 MG/5ML PO SUSP: 30.0000 mL | Freq: Every day | ORAL | Status: DC | PRN

## 2018-03-18 MED ORDER — ACETAMINOPHEN 325 MG PO TABS: 650.0000 mg | ORAL_TABLET | Freq: Four times a day (QID) | ORAL | Status: DC | PRN

## 2018-03-18 MED ORDER — HYDROXYZINE HCL 25 MG PO TABS: 25.0000 mg | ORAL_TABLET | Freq: Three times a day (TID) | ORAL | Status: DC | PRN

## 2018-03-18 MED ORDER — TRAZODONE HCL 50 MG PO TABS
50.0000 mg | ORAL_TABLET | Freq: Every evening | ORAL | Status: DC | PRN
  Administered 2018-03-18 – 2018-03-20 (×3): 50 mg via ORAL
  Filled 2018-03-18 (×2): qty 1

## 2018-03-18 MED ORDER — ALUM & MAG HYDROXIDE-SIMETH 200-200-20 MG/5ML PO SUSP: 30.0000 mL | ORAL | Status: DC | PRN

## 2018-03-18 NOTE — ED Notes (Signed)
ED Provider at bedside. 

## 2018-03-18 NOTE — BH Assessment (Signed)
[facility]  Per Juanetta BeetsJacqueline Norman, DO, this pt requires psychiatric hospitalization at this time.  Berneice Heinrichina Tate, RN, [id number].  Pt has signed Voluntary Admission and Consent for Treatment, as well as Consent to Release Information to [facility], [facility], and to the [facility], and notification calls have been placed to both.  Signed forms have been faxed to [facility].  Pt's nurse, Diane, has been notified, and agrees to send original paperwork along with pt via Juel [place], and to call report to [phone].  Doylene Canninghomas Raeleen Winstanley, [place] Behavioral Health Coordinator [phone]

## 2018-03-18 NOTE — BH Assessment (Signed)
[facility] Assessment Progress Note Case was staffed with Arville CareParks FNP who reccommended patient be admitted inpatient to assist with stabilization as appropriate bed placement is investigated.

## 2018-03-18 NOTE — ED Triage Notes (Signed)
Junior in college, arrived by POV. Presents today with SI, plan is to wrap cord around her neck. Patient is pleasant and cooperative. No physical complaints.
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
## 2018-03-18 NOTE — ED Notes (Signed)
Report given to Diane, RN.

## 2018-03-18 NOTE — ED Notes (Signed)
Bed: WA28 Expected date:  Expected time:  Means of arrival:  Comments: 

## 2018-03-18 NOTE — ED Notes (Signed)
Affect flat mood depressed. Now denies SI and said that she really wanted to get away from her mother with whom she lives. She is somewhat guarded so did not elaborate.

## 2018-03-18 NOTE — ED Notes (Signed)
[facility].  They will all when ready for pt to be transported.

## 2018-03-18 NOTE — ED Notes (Signed)
Pt transported to [facility] by [facility]. All belongings returned to pt who signed for same. Pt was calm and cooperative.  

## 2018-03-18 NOTE — Tx Team (Signed)
Initial Treatment Plan 03/18/2018 6:43 PM Tamara Rodriguez [id number]    PATIENT STRESSORS: Educational concerns Marital or family conflict   PATIENT STRENGTHS: Ability for insight Average or above average intelligence Capable of independent living General fund of knowledge   PATIENT IDENTIFIED PROBLEMS: Depression Suicidal thoughts "I need someone to talk to"                     DISCHARGE CRITERIA:  Ability to meet basic life and health needs Improved stabilization in mood, thinking, and/or behavior Reduction of life-threatening or endangering symptoms to within safe limits Verbal commitment to aftercare and medication compliance  PRELIMINARY DISCHARGE PLAN: Attend aftercare/continuing care group Return to previous living arrangement  PATIENT/FAMILY INVOLVEMENT: This treatment plan has been presented to and reviewed with the patient, Tamara Rodriguez, and/or family member, .  The patient and family have been given the opportunity to ask questions and make suggestions.  Elliet Goodnow, [place], [place]RN 03/18/2018, 6:43 PM

## 2018-03-18 NOTE — ED Notes (Signed)
Belongings located in locker #35; 1 pt belongings bag in total. Belongings include: Black shirt, bra, sandals, brown purse, I-phone with case, silver bracelet with charms, silver colored ring with clear gem stone, 2 black hair ties, 1 pair earrings with clear gem stone, nose ring, 1 set of keyes, tan wallet, $20 (1 bill), [place] [address], [place] [employer] ID, EBT card #[id number], Visa debit #[id number], Visa debit #[id number]. Belongings itemized on belongings sheet, located in pts chart at nurses station. Pt signed belongings sheet acknowledging her understanding that belongings will remain locked up until discharge. Pt also obtained phone numbers from cell phone before placing items in locker.

## 2018-03-18 NOTE — ED Notes (Signed)
[facility] Expected date:  Expected time:  Means of arrival:  Comments: Hold for room 30
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
## 2018-03-18 NOTE — ED Notes (Signed)
[facility] called and said ok to send pt. Called [facility] to transport.

## 2018-03-18 NOTE — ED Provider Notes (Signed)
[facility]-EMERGENCY DEPT Provider Note   CSN: [id number] Arrival date & time: 03/18/18  1050     History   Chief Complaint Chief Complaint  Patient presents with  . Suicidal    HPI Tamara Rodriguez is a 22 y.o. female.  She presents today at the request of her counselor for suicidal ideation.  She states she is been untreated depression for 2 years while in college.  She has a Veterinary surgeoncounselor but not on any medication.  She said increased and suicidal thoughts and her plan was to wrap a cord around her neck and hang herself.  She denies any ingestions and there were no prior suicide attempts.  No medical complaints.  Last menstrual period was last month.  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  The history is provided by the patient.  Mental Health Problem  Presenting symptoms: depression and suicidal thoughts   Onset quality:  Gradual Progression:  Worsening Chronicity:  New Context: not alcohol use, not drug abuse, not noncompliant and not recent medication change   Associated symptoms: no abdominal pain, no chest pain and no headaches     No past medical history on file.  Patient Active Problem List   Diagnosis Date Noted  . Chlamydia 11/29/2015  . Irregular menses 11/26/2015  . Encounter for Depo-Provera contraception 11/26/2015  . Right ankle sprain 05/16/2011    No past surgical history on file.   OB History   None      Home Medications    Prior to Admission medications   Not on File    Family History Family History  Problem Relation Age of Onset  . Diabetes Father   . Hypertension Father   . Asthma Mother   . Hypertension Maternal Aunt   . Cancer Maternal Aunt   . Hypertension Maternal Uncle   . Diabetes Paternal Aunt   . Diabetes Paternal Uncle   . Hypertension Maternal Grandmother   . Cancer Maternal Grandmother   . Hypertension Paternal Grandmother   . Heart attack Neg Hx   . Hyperlipidemia Neg Hx     . Sudden death Neg Hx     Social History Social History   Tobacco Use  . Smoking status: Never Smoker  . Smokeless tobacco: Never Used  Substance Use Topics  . Alcohol use: No    Alcohol/week: 0.0 oz  . Drug use: No     Allergies   Patient has no known allergies.   Review of Systems Review of Systems  Constitutional: Negative for fever.  HENT: Negative for sore throat.   Eyes: Negative for visual disturbance.  Respiratory: Negative for shortness of breath.   Cardiovascular: Negative for chest pain.  Gastrointestinal: Negative for abdominal pain.  Genitourinary: Negative for dysuria.  Musculoskeletal: Negative for neck pain.  Skin: Negative for rash.  Neurological: Negative for headaches.  Psychiatric/Behavioral: Positive for suicidal ideas.     Physical Exam Updated Vital Signs BP 100/62 (BP Location: Right Arm)   Pulse 88   Temp 98.6 F (37 C) (Oral)   Resp 16   SpO2 97%   Physical Exam  Constitutional: She appears well-developed and well-nourished.  HENT:  Head: Normocephalic and atraumatic.  Eyes: Conjunctivae are normal.  Neck: Neck supple.  Cardiovascular: Normal rate, regular rhythm, normal heart sounds and intact distal pulses.  Pulmonary/Chest: Effort normal and breath sounds normal. She has no wheezes.  Abdominal: Soft. She exhibits no mass. There is no tenderness.  There is no guarding.  Musculoskeletal: Normal range of motion. She exhibits no deformity.  Neurological: She is alert. GCS eye subscore is 4. GCS verbal subscore is 5. GCS motor subscore is 6.  Skin: Skin is warm and dry. Capillary refill takes less than 2 seconds.  Psychiatric: Her speech is normal and behavior is normal. She exhibits a depressed mood. She expresses suicidal ideation. She expresses suicidal plans.  Nursing note and vitals reviewed.    ED Treatments / Results  Labs (all labs ordered are listed, but only abnormal results are displayed) Labs Reviewed  CBC WITH  DIFFERENTIAL/PLATELET - Abnormal; Notable for the following components:      Result Value   Hemoglobin 15.5 (*)    HCT 46.6 (*)    All other components within normal limits  COMPREHENSIVE METABOLIC PANEL  ETHANOL  RAPID URINE DRUG SCREEN, HOSP PERFORMED  I-STAT BETA HCG BLOOD, ED ([facility], [facility], [facility] ONLY)    EKG None  Radiology No results found.  Procedures Procedures (including critical care time)  Medications Ordered in ED Medications - No data to display   Initial Impression / Assessment and Plan / ED Course  I have reviewed the triage vital signs and the nursing notes.  Pertinent labs & imaging results that were available during my care of the patient were reviewed by me and considered in my medical decision making (see chart for details).  Clinical Course as of Mar 19 1148  Mon Mar 18, 2018  1501 Patient has been evaluated behavioral health and I recommend inpatient admission to [facility]   [MB]    Clinical Course User Index [MB] Terrilee Files, MD     Final Clinical Impressions(s) / ED Diagnoses   Final diagnoses:  Depression, unspecified depression type    ED Discharge Orders    None       Terrilee Files, MD 03/19/18 1149

## 2018-03-19 DIAGNOSIS — Z811 Family history of alcohol abuse and dependence: Secondary | ICD-10-CM

## 2018-03-19 DIAGNOSIS — F331 Major depressive disorder, recurrent, moderate: Principal | ICD-10-CM

## 2018-03-19 MED ORDER — CITALOPRAM HYDROBROMIDE 10 MG PO TABS
10.0000 mg | ORAL_TABLET | Freq: Every day | ORAL | Status: DC
  Administered 2018-03-19 – 2018-03-21 (×3): 10 mg via ORAL
  Filled 2018-03-19 (×5): qty 1

## 2018-03-19 NOTE — Progress Notes (Signed)
Adult Psychoeducational Group Note  Date:  03/19/2018 Time:  3:41 AM  Group Topic/Focus:  Wrap-Up Group:   The focus of this group is to help patients review their daily goal of treatment and discuss progress on daily workbooks.  Participation Level:  Active  Participation Quality:  Appropriate  Affect:  Appropriate  Cognitive:  Appropriate  Insight: Appropriate  Engagement in Group:  Engaged  Modes of Intervention:  Discussion  Additional Comments:  Pt stated he had a rough day. Pt rated the day at 5/10.  Tamara Rodriguez 03/19/2018, 3:41 AM
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
## 2018-03-19 NOTE — Progress Notes (Signed)
Recreation Therapy Notes  Animal-Assisted Activity (AAA) Program Checklist/Progress Notes Patient Eligibility Criteria Checklist & Daily Group note for Rec Tx Intervention  Date: 7.23.19 Time: 1430 Location: [address] Group Room   AAA/T Program Assumption of Risk Form signed by Patient/ or Parent Legal Guardian YES   Patient is free of allergies or sever asthma YES   Patient reports no fear of animals YES  Patient reports no history of cruelty to animals YES   Patient understands his/her participation is voluntary YES   Patient washes hands before animal contact YES   Patient washes hands after animal contact YES   Behavioral Response: Engaged  Education: Hand Washing, Appropriate Animal Interaction   Education Outcome: Acknowledges understanding/In group clarification offered/Needs additional education.   Clinical Observations/Feedback:  Pt attended and participated in activity.    Tamara Rodriguez, LRT/CTRS         Tamara Rodriguez 03/19/2018 3:43 PM 

## 2018-03-19 NOTE — [facility] Suicide Risk Assessment (Signed)
[facility] INPATIENT:  Family/Significant Other Suicide Prevention Education  Suicide Prevention Education:   Patient Refusal for Family/Significant Other Suicide Prevention Education: The patient Tamara Rodriguez has refused to provide written consent for family/significant other to be provided Family/Significant Other Suicide Prevention Education during admission and/or prior to discharge.  Physician notified.   Roselyn Beringegina Breianna Delfino, MSW, LCSW Clinical Social Work 03/19/2018, 3:27 PM

## 2018-03-19 NOTE — [facility] Group Notes (Signed)
LCSW Group Therapy Note   03/19/2018   1:30PM  Type of Therapy/Topic: Group Therapy: Feelings about Diagnosis  Participation Level: Active   Description of Group:  This group will allow patients to explore their thoughts and feelings about diagnoses they have received. Patients will be guided to explore their level of understanding and acceptance of these diagnoses. Facilitator will encourage patients to process their thoughts and feelings about the reactions of others to their diagnosis and will guide patients in identifying ways to discuss their diagnosis with significant others in their lives. This group will be process-oriented, with patients participating in exploration of their own experiences, giving and receiving support, and processing challenge from other group members.  Therapeutic Goals: 1. Patient will demonstrate understanding of diagnosis as evidenced by identifying two or more symptoms of the disorder 2. Patient will be able to express two feelings regarding the diagnosis 3. Patient will demonstrate their ability to communicate their needs through discussion and/or role play  Summary of Patient Progress: Patient actively participated in group discussion. She identified reasons people don't accept their diagnosis. She stated that she was initially diagnosed with depression about 3 years ago but failed to follow through with treatment. She struggled with identifying two positive traits about herself that make her a unique individual.   Therapeutic Modalities:  Cognitive Behavioral Therapy Brief Therapy Feelings Identification    Roselyn Beringegina Somaly Marteney, MSW, LCSW Clinical Social Worker Phone: [phone]

## 2018-03-19 NOTE — Plan of Care (Signed)
  Problem: Coping: Goal: Ability to demonstrate self-control will improve Outcome: Progressing   Problem: Safety: Goal: Periods of time without injury will increase Outcome: Progressing   Problem: Medication: Goal: Compliance with prescribed medication regimen will improve Outcome: Progressing   

## 2018-03-19 NOTE — [facility] Suicide Risk Assessment (Signed)
[facility]   Nursing information obtained from:  Patient Demographic factors:  Adolescent or young adult Current Mental Status:  Suicidal ideation indicated by patient, Self-harm thoughts Loss Factors:  NA Historical Factors:  NA Risk Reduction Factors:  Living with another person, especially a relative, Positive coping skills or problem solving skills  Total Time spent with patient: 45 minutes Principal Problem: Depression Diagnosis:   Patient Active Problem List   Diagnosis Date Noted  . Depression [F32.9] 03/18/2018  . Chlamydia [A74.9] 11/29/2015  . Irregular menses [N92.6] 11/26/2015  . Encounter for Depo-Provera contraception [Z30.42] 11/26/2015  . Right ankle sprain [S93.401A] 05/16/2011   Subjective Data:   Continued Clinical Symptoms:  Alcohol Use Disorder Identification Test Final Score (AUDIT): 0 The "Alcohol Use Disorders Identification Test", Guidelines for Use in Primary Care, Second Edition.  World Science writerHealth Organization [facility](WHO). Score between 0-7:  no or low risk or alcohol related problems. Score between 8-15:  moderate risk of alcohol related problems. Score between 16-19:  high risk of alcohol related problems. Score 20 or above:  warrants further diagnostic evaluation for alcohol dependence and treatment.   CLINICAL FACTORS:  22 year old female, lives with mother, in college, reports history of depression which waxes and wanes in severity, endorses some neuro-vegetative symptoms of depression and intermittent passive SI. Yesterday developed suicidal ideations of hanging self , following argument with mother.  Psychiatric Specialty Exam: Physical Exam  ROS  Blood pressure 102/70, pulse 86, temperature 98.6 F (37 C), temperature source Oral, resp. rate 18, height 5\' 1"  (1.549 m), weight 49.9 kg (110 lb).Body mass index is 20.78 kg/m.  See admit note MSE   COGNITIVE FEATURES THAT CONTRIBUTE TO RISK:  Closed-mindedness and Loss of  executive function    SUICIDE RISK:   Moderate:  Frequent suicidal ideation with limited intensity, and duration, some specificity in terms of plans, no associated intent, good self-control, limited dysphoria/symptomatology, some risk factors present, and identifiable protective factors, including available and accessible social support.  PLAN OF CARE: Patient will be admitted to inpatient psychiatric unit for stabilization and safety. Will provide and encourage milieu participation. Provide medication management and maked adjustments as needed.  Will follow daily.    I certify that inpatient services furnished can reasonably be expected to improve the patient's condition.   Craige CottaFernando A Cobos, MD 03/19/2018, 1:20 PM

## 2018-03-19 NOTE — Progress Notes (Signed)
Nursing Progress Note: 7p-7a D: Pt currently presents with a anxious/depressed/pleasasnt on approach affect and behavior. Pt states "I had a good day." Interacting appropriately with the milieu. Pt reports good sleep during the previous night with current medication regimen. Pt did not attend wrap-up group.  A: Pt provided with medications per providers orders. Pt's labs and vitals were monitored throughout the night. Pt supported emotionally and encouraged to express concerns and questions. Pt educated on medications.  R: Pt's safety ensured with 15 minute and environmental checks. Pt currently denies SI, HI, and AVH. Pt verbally contracts to seek staff if SI,HI, or AVH occurs and to consult with staff before acting on any harmful thoughts. Will continue to monitor.

## 2018-03-19 NOTE — H&P (Signed)
Psychiatric Admission Assessment Adult  Patient Identification: Tamara Rodriguez MRN:  [id number] Date of Evaluation:  03/19/2018 Chief Complaint:  Depression Principal Diagnosis:  MDD, no psychotic features , Suicidal Ideations Diagnosis:   Patient Active Problem List   Diagnosis Date Noted  . Depression [F32.9] 03/18/2018  . Chlamydia [A74.9] 11/29/2015  . Irregular menses [N92.6] 11/26/2015  . Encounter for Depo-Provera contraception [Z30.42] 11/26/2015  . Right ankle sprain [S93.401A] 05/16/2011   History of Present Illness:22 year old single  female, lives with mother, employed, in college. States she has a history of depression, which she states waxes and wanes ( although states " it gets better but does not go away completely)  particularly since she started college two years ago. She states she was experiencing intermittent suicidal ideations, described as passive ( " like not wanting to be here, wishing I would not wake up" ) but on day of admission developed thoughts of hanging self on day of admission triggered by an argument with her mother. States she actually set a noose with an extension cord , but decided against it and told her therapist, who recommended inpatient admission. Endorses neuro -vegetative symptoms Associated Signs/Symptoms: Depression Symptoms:  depressed mood, anhedonia, insomnia, suicidal thoughts with specific plan, loss of energy/fatigue, variable appetite  Decreased sense of self esteem (Hypo) Manic Symptoms:  none noted or endorsed  Anxiety Symptoms:  Reports feeling she worries excessively, denies panic attacks  Psychotic Symptoms: denies  PTSD Symptoms: Denies  Total Time spent with patient: 45 minutes  Past Psychiatric History: denies prior psychiatric admissions, denies prior history of suicidal attempts, denies history of self cutting or self injurious ideations, no history of psychosis, denies history of mania/hypomania, denies history of  PTSD. Denies history of violence .  Describes history of depression, worse since she started college two years ago, which she states waxes and wanes- also identifies seasonal component, states it tends to be worse during Spring time. Denies panic or agoraphobia, describes some social anxiety symptoms. States she has not been treated with psychiatric medications .  Is the patient at risk to self? Yes.    Has the patient been a risk to self in the past 6 months? Yes.    Has the patient been a risk to self within the distant past? No.  Is the patient a risk to others? No.  Has the patient been a risk to others in the past 6 months? No.  Has the patient been a risk to others within the distant past? No.   Prior Inpatient Therapy:  No  Prior Outpatient Therapy:  had been seeing a therapist prior to admission Sharyn Creamer)   Alcohol Screening: 1. How often do you have a drink containing alcohol?: Never 2. How many drinks containing alcohol do you have on a typical day when you are drinking?: 1 or 2 3. How often do you have six or more drinks on one occasion?: Never AUDIT-C Score: 0 4. How often during the last year have you found that you were not able to stop drinking once you had started?: Never 5. How often during the last year have you failed to do what was normally expected from you becasue of drinking?: Never 6. How often during the last year have you needed a first drink in the morning to get yourself going after a heavy drinking session?: Never 7. How often during the last year have you had a feeling of guilt of remorse after drinking?: Never 8.  How often during the last year have you been unable to remember what happened the night before because you had been drinking?: Never 9. Have you or someone else been injured as a result of your drinking?: No 10. Has a relative or friend or a doctor or another health worker been concerned about your drinking or suggested you cut down?:  No Alcohol Use Disorder Identification Test Final Score (AUDIT): 0 Intervention/Follow-up: AUDIT Score <7 follow-up not indicated Substance Abuse History in the last 12 months:  No alcohol or drug abuse  Consequences of Substance Abuse: Denies  Previous Psychotropic Medications: States she has never been on psychiatric medications  Psychological Evaluations:  No  Past Medical History: denies medical illnesses, NKDA Family History: parents alive, separated, no siblings Family History  Problem Relation Age of Onset  . Diabetes Father   . Hypertension Father   . Asthma Mother   . Hypertension Maternal Aunt   . Cancer Maternal Aunt   . Hypertension Maternal Uncle   . Diabetes Paternal Aunt   . Diabetes Paternal Uncle   . Hypertension Maternal Grandmother   . Cancer Maternal Grandmother   . Hypertension Paternal Grandmother   . Heart attack Neg Hx   . Hyperlipidemia Neg Hx   . Sudden death Neg Hx    Family Psychiatric  History: history of depression in paternal extended family, father has history of alcohol abuse , no suicides in family.  Tobacco Screening: does not smoke or use tobacco products  Social History: 22 years old single female, lives with mother, employed at [employer], also in college ( AT ) , currently enrolled in a Summer class .  Social History   Substance and Sexual Activity  Alcohol Use No  . Alcohol/week: 0.0 oz     Social History   Substance and Sexual Activity  Drug Use No    Additional Social History:  Allergies:   Allergies  Allergen Reactions  . Latex Hives   Lab Results:  Results for orders placed or performed during the hospital encounter of 03/18/18 (from the past 48 hour(s))  Comprehensive metabolic panel     Status: None   Collection Time: 03/18/18 11:20 AM  Result Value Ref Range   Sodium 140 135 - 145 mmol/L   Potassium 3.9 3.5 - 5.1 mmol/L   Chloride 106 98 - 111 mmol/L   CO2 26 22 - 32 mmol/L   Glucose, Bld 99 70 - 99 mg/dL    BUN 11 6 - 20 mg/dL   Creatinine, Ser 0.93 0.44 - 1.00 mg/dL   Calcium 9.7 8.9 - 10.3 mg/dL   Total Protein 7.9 6.5 - 8.1 g/dL   Albumin 4.2 3.5 - 5.0 g/dL   AST 21 15 - 41 U/L   ALT 15 0 - 44 U/L   Alkaline Phosphatase 88 38 - 126 U/L   Total Bilirubin 0.9 0.3 - 1.2 mg/dL   GFR calc non Af Amer >60 >60 mL/min   GFR calc Af Amer >60 >60 mL/min    Comment: (NOTE) The eGFR has been calculated using the CKD EPI equation. This calculation has not been validated in all clinical situations. eGFR's persistently <60 mL/min signify possible Chronic Kidney Disease.    Anion gap 8 5 - 15    Comment: Performed at [facility], [place] [address] [place], [place] [address]  Ethanol     Status: None   Collection Time: 03/18/18 11:20 AM  Result Value Ref Range  Alcohol, Ethyl (B) <10 <10 mg/dL    Comment: (NOTE) Lowest detectable limit for serum alcohol is 10 mg/dL. For medical purposes only. Performed at [facility], [place] [address], [place], [place] [address]   Urine rapid drug screen (hosp performed)     Status: None   Collection Time: 03/18/18 11:20 AM  Result Value Ref Range   Opiates NONE DETECTED NONE DETECTED   Cocaine NONE DETECTED NONE DETECTED   Benzodiazepines NONE DETECTED NONE DETECTED   Amphetamines NONE DETECTED NONE DETECTED   Tetrahydrocannabinol NONE DETECTED NONE DETECTED   Barbiturates NONE DETECTED NONE DETECTED    Comment: (NOTE) DRUG SCREEN FOR MEDICAL PURPOSES ONLY.  IF CONFIRMATION IS NEEDED FOR ANY PURPOSE, NOTIFY LAB WITHIN 5 DAYS. LOWEST DETECTABLE LIMITS FOR URINE DRUG SCREEN Drug Class                     Cutoff (ng/mL) Amphetamine and metabolites    1000 Barbiturate and metabolites    200 Benzodiazepine                 768 Tricyclics and metabolites     300 Opiates and metabolites        300 Cocaine and metabolites        300 THC                            50 Performed at [facility], [place]  [address] [place], [place] [address]   CBC with Diff     Status: Abnormal   Collection Time: 03/18/18 11:20 AM  Result Value Ref Range   WBC 5.0 4.0 - 10.5 K/uL   RBC 5.11 3.87 - 5.11 MIL/uL   Hemoglobin 15.5 (H) 12.0 - 15.0 g/dL   HCT 46.6 (H) 36.0 - 46.0 %   MCV 91.2 78.0 - 100.0 fL   MCH 30.3 26.0 - 34.0 pg   MCHC 33.3 30.0 - 36.0 g/dL   RDW 13.3 11.5 - 15.5 %   Platelets 252 150 - 400 K/uL   Neutrophils Relative % 51 %   Neutro Abs 2.6 1.7 - 7.7 K/uL   Lymphocytes Relative 38 %   Lymphs Abs 1.9 0.7 - 4.0 K/uL   Monocytes Relative 6 %   Monocytes Absolute 0.3 0.1 - 1.0 K/uL   Eosinophils Relative 4 %   Eosinophils Absolute 0.2 0.0 - 0.7 K/uL   Basophils Relative 1 %   Basophils Absolute 0.0 0.0 - 0.1 K/uL    Comment: Performed at [facility], [place] [address], [place], [place] [address]  I-Stat beta hCG blood, ED     Status: None   Collection Time: 03/18/18 11:44 AM  Result Value Ref Range   I-stat hCG, quantitative <5.0 <5 mIU/mL   Comment 3            Comment:   GEST. AGE      CONC.  (mIU/mL)   <=1 WEEK        5 - 50     2 WEEKS       50 - 500     3 WEEKS       100 - 10,000     4 WEEKS     1,000 - 30,000        FEMALE AND NON-PREGNANT FEMALE:     LESS THAN 5 mIU/mL     Blood Alcohol level:  Lab  Results  Component Value Date   ETH <10 30/94/0768    Metabolic Disorder Labs:  No results found for: HGBA1C, MPG Lab Results  Component Value Date   PROLACTIN 7.1 11/26/2015   No results found for: CHOL, TRIG, HDL, CHOLHDL, VLDL, LDLCALC  Current Medications: Current Facility-Administered Medications  Medication Dose Route Frequency Provider Last Rate Last Dose  . acetaminophen (TYLENOL) tablet 650 mg  650 mg Oral Q6H PRN Ethelene Hal, NP      . alum & mag hydroxide-simeth (MAALOX/MYLANTA) 200-200-20 MG/5ML suspension 30 mL  30 mL Oral Q4H PRN Ethelene Hal, NP      . hydrOXYzine (ATARAX/VISTARIL) tablet 25 mg  25 mg Oral TID  PRN Ethelene Hal, NP      . magnesium hydroxide (MILK OF MAGNESIA) suspension 30 mL  30 mL Oral Daily PRN Ethelene Hal, NP      . traZODone (DESYREL) tablet 50 mg  50 mg Oral QHS PRN Ethelene Hal, NP   50 mg at 03/18/18 2147   PTA Medications: No medications prior to admission.    Musculoskeletal: Strength & Muscle Tone: within normal limits Gait & Station: normal Patient leans: N/A  Psychiatric Specialty Exam: Physical Exam  Review of Systems  Constitutional: Negative.   HENT: Negative.   Eyes: Negative.   Respiratory: Negative.   Cardiovascular: Negative.   Gastrointestinal: Negative.   Genitourinary: Negative.   Musculoskeletal: Negative.   Skin: Negative.   Neurological: Negative for seizures and headaches.  Endo/Heme/Allergies: Negative.   Psychiatric/Behavioral: Positive for depression and suicidal ideas.  All other systems reviewed and are negative.   Blood pressure 102/70, pulse 86, temperature 98.6 F (37 C), temperature source Oral, resp. rate 18, height '5\' 1"'  (1.549 m), weight 110 lb (49.9 kg).Body mass index is 20.78 kg/m.  General Appearance: Well Groomed  Eye Contact:  Fair  Speech:  Normal Rate  Volume:  Normal  Mood:  Depressed, states she is feeling better than yesterday  Affect:  vaguely constricted, but smiles at times appropriately , tends to improve as session progresses   Thought Process:  Linear and Descriptions of Associations: Intact  Orientation:  Other:  fully alert and attentive  Thought Content:  denies hallucinations, no delusions, not internally preoccupied   Suicidal Thoughts:  No- denies any suicidal or self injurious ideations, contracts for safety, denies any homicidal or violent ideations  Homicidal Thoughts:  No  Memory:  recent and remote grossly intact   Judgement:  Fair  Insight:  Fair  Psychomotor Activity:  Decreased  Concentration:  Concentration: Good and Attention Span: Good  Recall:  Good  Fund  of Knowledge:  Good  Language:  Good  Akathisia:  Negative  Handed:  Right  AIMS (if indicated):     Assets:  Communication Skills Desire for Improvement Resilience  ADL's:  Intact  Cognition:  WNL  Sleep:  Number of Hours: 6.25    Treatment Plan Summary: Daily contact with patient to assess and evaluate symptoms and progress in treatment, Medication management, Plan inpatient treatment  and medications as below  Observation Level/Precautions:  15 minute checks  Laboratory:  as needed   Psychotherapy:  Milieu, group therapy  Medications:  Agrees to  antidepressant trial- we discussed medication options, agrees to [employer] trial- start 10 mgr QDAY    Consultations:  As needed   Discharge Concerns:    Estimated LOS: 4 days   Other:     Physician Treatment Plan for Primary  Diagnosis:  MDD, no psychotic features  Long Term Goal(s): Improvement in symptoms so as ready for discharge  Short Term Goals: Ability to identify changes in lifestyle to reduce recurrence of condition will improve and Ability to maintain clinical measurements within normal limits will improve  Physician Treatment Plan for Secondary Diagnosis: Consider Social Phobia Long Term Goal(s): Improvement in symptoms so as ready for discharge  Short Term Goals: Ability to identify changes in lifestyle to reduce recurrence of condition will improve, Ability to verbalize feelings will improve, Ability to disclose and discuss suicidal ideas, Ability to demonstrate self-control will improve, Ability to identify and develop effective coping behaviors will improve and Ability to maintain clinical measurements within normal limits will improve  I certify that inpatient services furnished can reasonably be expected to improve the patient's condition.    Vassie Moselle, Medical Student 7/23/20199:38 AM

## 2018-03-19 NOTE — Progress Notes (Signed)
Data: Pt presents with an anxious mood. Pt reports feeling a lot better today after taking the Trazodone last night for sleep.Marland Kitchen. Pt verbalized that the medication last night helped calm her down. Pt expressed that she's waiting to speak with the doctor to start medications for depression. Pt reports untreated depression since freshman year in college. Pt denies SI/HI. No concerns verbalized by pt during assessment.   Action: Orders and medications reviewed with pt. Verbal support provided to pt. Pt encouraged to attend groups. 15-minute checks performed for safety.  Response: Pt compliant with tx plan.

## 2018-03-19 NOTE — Progress Notes (Signed)
D:  Tamara Rodriguez was visible on the unit.  She appeared anxious and scared.  She denied SI/HI or A/V hallucinations.  She was worried about her safety on the unit.  Reassured her that staff are doing 15 minute checks, cameras are in the hallways and staff are sitting in the hallways.  She felt reassured.  She reported that she was worried that she wouldn't sleep and trazodone 50mg  was given prn.  List of medications provided and she was going to relay this to her mother.  She denied any pain or discomfort and appeared to be in no physical distress.  She is currently resting with her eyes closed and appears to be asleep. A:  1:1 with RN for support and encouragement.  Medications as ordered.  Q 15 minute checks maintained for safety.  Encouraged participation in group and unit activities.   R:  Tamara Rodriguez remains safe on the unit.  We will continue to monitor the progress towards her goals.

## 2018-03-20 LAB — TSH: TSH: 2.098 u[IU]/mL (ref 0.350–4.500)

## 2018-03-20 NOTE — Progress Notes (Signed)
Adult Psychoeducational Group Note  Date:  03/20/2018 Time:  9:59 PM  Group Topic/Focus:  Wrap-Up Group:   The focus of this group is to help patients review their daily goal of treatment and discuss progress on daily workbooks.  Participation Level:  Active  Participation Quality:  Appropriate  Affect:  Appropriate  Cognitive:  Alert  Insight: Appropriate  Engagement in Group:  Engaged  Modes of Intervention:  Discussion  Additional Comments:  Patient stated having a good day. Patient's goal for today was to not get depressed.   Tmya Wigington L Jenell Dobransky 03/20/2018, 9:59 PM

## 2018-03-20 NOTE — Progress Notes (Addendum)
[facility] MD Progress Note  03/20/2018 12:23 PM Tamara Rodriguez  MRN:  [id number] Subjective: Patient reports she is feeling better.  She attributes improvement partly to a visit from her mother and father yesterday evening.  Relationship issues with mother have been a source of stress but she states that "we had a heart to heart conversation yesterday , we both apologized and we felt a lot better". Currently denies medication side effects. Denies suicidal ideations. Objective: I have discussed case with treatment team and have met with patient. 22 year old single female, employed, in college, reports history of chronic depression, intermittent suicidal ideations, presented due to exacerbated to depression and suicidal ideations in the context of an argument with her mother. As above, states she is feeling significantly better, does present with an improved range of affect.  States she has had good conversations with mother and feels she has a supportive family system.  Denies medication side effects.  She is currently on Celexa at 10 mg daily. Denies suicidal ideations.  Has been visible on unit, going to some groups, no disruptive behaviors, pleasant on approach. TSH WNL.  Principal Problem: Depression  Diagnosis:   Patient Active Problem List   Diagnosis Date Noted  . Depression [F32.9] 03/18/2018  . Chlamydia [A74.9] 11/29/2015  . Irregular menses [N92.6] 11/26/2015  . Encounter for Depo-Provera contraception [Z30.42] 11/26/2015  . Right ankle sprain [S93.401A] 05/16/2011   Total Time spent with patient: 20 minutes  Past Psychiatric History:   Past Medical History: History reviewed. No pertinent past medical history. History reviewed. No pertinent surgical history. Family History:  Family History  Problem Relation Age of Onset  . Diabetes Father   . Hypertension Father   . Asthma Mother   . Hypertension Maternal Aunt   . Cancer Maternal Aunt   . Hypertension Maternal Uncle   .  Diabetes Paternal Aunt   . Diabetes Paternal Uncle   . Hypertension Maternal Grandmother   . Cancer Maternal Grandmother   . Hypertension Paternal Grandmother   . Heart attack Neg Hx   . Hyperlipidemia Neg Hx   . Sudden death Neg Hx    Family Psychiatric  History:  Social History:  Social History   Substance and Sexual Activity  Alcohol Use No  . Alcohol/week: 0.0 oz     Social History   Substance and Sexual Activity  Drug Use No    Social History   Socioeconomic History  . Marital status: Single    Spouse name: Not on file  . Number of children: Not on file  . Years of education: Not on file  . Highest education level: Not on file  Occupational History  . Not on file  Social Needs  . Financial resource strain: Not on file  . Food insecurity:    Worry: Not on file    Inability: Not on file  . Transportation needs:    Medical: Not on file    Non-medical: Not on file  Tobacco Use  . Smoking status: Never Smoker  . Smokeless tobacco: Never Used  Substance and Sexual Activity  . Alcohol use: No    Alcohol/week: 0.0 oz  . Drug use: No  . Sexual activity: Not Currently    Birth control/protection: Injection    Comment: proctected sex a while ago   Lifestyle  . Physical activity:    Days per week: Not on file    Minutes per session: Not on file  . Stress: Not on file  Relationships  .  Social connections:    Talks on phone: Not on file    Gets together: Not on file    Attends religious service: Not on file    Active member of club or organization: Not on file    Attends meetings of clubs or organizations: Not on file    Relationship status: Not on file  Other Topics Concern  . Not on file  Social History Narrative  . Not on file   Additional Social History:   Sleep: Good  Appetite:  Good  Current Medications: Current Facility-Administered Medications  Medication Dose Route Frequency Provider Last Rate Last Dose  . acetaminophen (TYLENOL) tablet 650  mg  650 mg Oral Q6H PRN Ethelene Hal, NP      . alum & mag hydroxide-simeth (MAALOX/MYLANTA) 200-200-20 MG/5ML suspension 30 mL  30 mL Oral Q4H PRN Ethelene Hal, NP      . citalopram (CELEXA) tablet 10 mg  10 mg Oral Daily Cobos, Myer Peer, MD   10 mg at 03/20/18 0809  . hydrOXYzine (ATARAX/VISTARIL) tablet 25 mg  25 mg Oral TID PRN Ethelene Hal, NP      . magnesium hydroxide (MILK OF MAGNESIA) suspension 30 mL  30 mL Oral Daily PRN Ethelene Hal, NP      . traZODone (DESYREL) tablet 50 mg  50 mg Oral QHS PRN Ethelene Hal, NP   50 mg at 03/19/18 2216    Lab Results:  Results for orders placed or performed during the hospital encounter of 03/18/18 (from the past 48 hour(s))  TSH     Status: None   Collection Time: 03/20/18  6:18 AM  Result Value Ref Range   TSH 2.098 0.350 - 4.500 uIU/mL    Comment: Performed by a 3rd Generation assay with a functional sensitivity of <=0.01 uIU/mL. Performed at [facility], [place] [address] [place], [place] [address]     Blood Alcohol level:  Lab Results  Component Value Date   ETH <10 35/46/5681    Metabolic Disorder Labs: No results found for: HGBA1C, MPG Lab Results  Component Value Date   PROLACTIN 7.1 11/26/2015   No results found for: CHOL, TRIG, HDL, CHOLHDL, VLDL, LDLCALC  Physical Findings: AIMS: Facial and Oral Movements Muscles of Facial Expression: None, normal Lips and Perioral Area: None, normal Jaw: None, normal Tongue: None, normal,Extremity Movements Upper (arms, wrists, hands, fingers): None, normal Lower (legs, knees, ankles, toes): None, normal, Trunk Movements Neck, shoulders, hips: None, normal, Overall Severity Severity of abnormal movements (highest score from questions above): None, normal Incapacitation due to abnormal movements: None, normal Patient's awareness of abnormal movements (rate only patient's report): No Awareness, Dental Status Current  problems with teeth and/or dentures?: No Does patient usually wear dentures?: No  CIWA:    COWS:     Musculoskeletal: Strength & Muscle Tone: within normal limits Gait & Station: normal Patient leans: N/A  Psychiatric Specialty Exam: Physical Exam  ROS no chest pain, no shortness of breath, no vomiting  Blood pressure 97/66, pulse 93, temperature 98.2 F (36.8 C), temperature source Oral, resp. rate 18, height _0  (1.549 m), weight 49.9 kg (110 lb).Body mass index is 20.78 kg/m.  General Appearance: Improved grooming  Eye Contact:  Good  Speech:  Normal Rate  Volume:  Normal  Mood:  Reports feeling better, less depressed  Affect:  Becoming more reactive  Thought Process:  Linear and Descriptions of Associations: Intact  Orientation:  Full (Time,  Place, and Person)  Thought Content:  No hallucinations, no delusions  Suicidal Thoughts:  No currently denies suicidal or self-injurious thoughts, denies any homicidal or violent ideations  Homicidal Thoughts:  No  Memory:  Recent and remote grossly intact  Judgement:  Other:  Improving  Insight:  Improving  Psychomotor Activity:  Normal  Concentration:  Concentration: Good and Attention Span: Good  Recall:  Good  Fund of Knowledge:  Good  Language:  Good  Akathisia:  Negative  Handed:  Right  AIMS (if indicated):     Assets:  Communication Skills Desire for Improvement Resilience  ADL's:  Intact  Cognition:  WNL  Sleep:  Number of Hours: 6.75   Assessment -22 year old single female, employed, in college, history of chronic depression, recent worsening depression, suicidal ideations, neurovegetative symptoms of depression. Today reports improvement, presents with improved mood and fuller range of affect, attributes this in part to a good visit with her mother yesterday evening.  Thus far tolerating Celexa trial well.  Treatment Plan Summary: Daily contact with patient to assess and evaluate symptoms and progress in  treatment, Medication management, Plan Inpatient treatment and Medications as below Encourage group and milieu participation to work on coping skills and symptom reduction Continue Celexa 10 mg daily for depression and anxiety Continue Vistaril 25 mg every 8 hours PRN for anxiety as needed Continue Trazodone 50 mg nightly PRN for insomnia as needed Treatment team working on disposition [place], MD 03/20/2018, 12:23 PM

## 2018-03-20 NOTE — Tx Team (Signed)
Interdisciplinary Treatment and Diagnostic Plan Update  03/20/2018 Time of Session: 1047 Tamara Rodriguez MRN: [id number]  Principal Diagnosis: <principal problem not specified>  Secondary Diagnoses: Active Problems:   Depression   Current Medications:  Current Facility-Administered Medications  Medication Dose Route Frequency Provider Last Rate Last Dose  . acetaminophen (TYLENOL) tablet 650 mg  650 mg Oral Q6H PRN Laveda AbbeParks, Laurie Britton, NP      . alum & mag hydroxide-simeth (MAALOX/MYLANTA) 200-200-20 MG/5ML suspension 30 mL  30 mL Oral Q4H PRN Laveda AbbeParks, Laurie Britton, NP      . citalopram (CELEXA) tablet 10 mg  10 mg Oral Daily Cobos, Rockey SituFernando A, MD   10 mg at 03/20/18 0809  . hydrOXYzine (ATARAX/VISTARIL) tablet 25 mg  25 mg Oral TID PRN Laveda AbbeParks, Laurie Britton, NP      . magnesium hydroxide (MILK OF MAGNESIA) suspension 30 mL  30 mL Oral Daily PRN Laveda AbbeParks, Laurie Britton, NP      . traZODone (DESYREL) tablet 50 mg  50 mg Oral QHS PRN Laveda AbbeParks, Laurie Britton, NP   50 mg at 03/19/18 2216   PTA Medications: No medications prior to admission.    Patient Stressors: Educational concerns Marital or family conflict  Patient Strengths: Ability for insight Average or above average intelligence Capable of independent living [employer]  Treatment Modalities: Medication Management, Group therapy, Case management,  1 to 1 session with clinician, Psychoeducation, Recreational therapy.   Physician Treatment Plan for Primary Diagnosis: <principal problem not specified> Long Term Goal(s): Improvement in symptoms so as ready for discharge Improvement in symptoms so as ready for discharge   Short Term Goals: Ability to identify changes in lifestyle to reduce recurrence of condition will improve Ability to maintain clinical measurements within normal limits will improve Ability to identify changes in lifestyle to reduce recurrence of condition will improve Ability to verbalize  feelings will improve Ability to disclose and discuss suicidal ideas Ability to demonstrate self-control will improve Ability to identify and develop effective coping behaviors will improve Ability to maintain clinical measurements within normal limits will improve  Medication Management: Evaluate patient's response, side effects, and tolerance of medication regimen.  Therapeutic Interventions: 1 to 1 sessions, Unit Group sessions and Medication administration.  Evaluation of Outcomes: Progressing  Physician Treatment Plan for Secondary Diagnosis: Active Problems:   Depression  Long Term Goal(s): Improvement in symptoms so as ready for discharge Improvement in symptoms so as ready for discharge   Short Term Goals: Ability to identify changes in lifestyle to reduce recurrence of condition will improve Ability to maintain clinical measurements within normal limits will improve Ability to identify changes in lifestyle to reduce recurrence of condition will improve Ability to verbalize feelings will improve Ability to disclose and discuss suicidal ideas Ability to demonstrate self-control will improve Ability to identify and develop effective coping behaviors will improve Ability to maintain clinical measurements within normal limits will improve     Medication Management: Evaluate patient's response, side effects, and tolerance of medication regimen.  Therapeutic Interventions: 1 to 1 sessions, Unit Group sessions and Medication administration.  Evaluation of Outcomes: Progressing   RN Treatment Plan for Primary Diagnosis: <principal problem not specified> Long Term Goal(s): Knowledge of disease and therapeutic regimen to maintain health will improve  Short Term Goals: Ability to identify and develop effective coping behaviors will improve and Compliance with prescribed medications will improve  Medication Management: RN will administer medications as ordered by provider, will  assess and evaluate patient's  response and provide education to patient for prescribed medication. RN will report any adverse and/or side effects to prescribing provider.  Therapeutic Interventions: 1 on 1 counseling sessions, Psychoeducation, Medication administration, Evaluate responses to treatment, Monitor vital signs and CBGs as ordered, Perform/monitor CIWA, COWS, AIMS and Fall Risk screenings as ordered, Perform wound care treatments as ordered.  Evaluation of Outcomes: Progressing   LCSW Treatment Plan for Primary Diagnosis: <principal problem not specified> Long Term Goal(s): Safe transition to appropriate next level of care at discharge, Engage patient in therapeutic group addressing interpersonal concerns.  Short Term Goals: Engage patient in aftercare planning with referrals and resources, Increase social support and Increase skills for wellness and recovery  Therapeutic Interventions: Assess for all discharge needs, 1 to 1 time with Social worker, Explore available resources and support systems, Assess for adequacy in community support network, Educate family and significant other(s) on suicide prevention, Complete Psychosocial Assessment, Interpersonal group therapy.  Evaluation of Outcomes: Progressing   Progress in Treatment: Attending groups: Yes. Participating in groups: Yes. Taking medication as prescribed: Yes. Toleration medication: Yes. Family/Significant other contact made: No, will contact:  pt declined consent Patient understands diagnosis: Yes. Discussing patient identified problems/goals with staff: Yes. Medical problems stabilized or resolved: Yes. Denies suicidal/homicidal ideation: Yes. Issues/concerns per patient self-inventory: No. Other: none  New problem(s) identified: No, Describe:  none  New Short Term/Long Term Goal(s):  Patient Goals:  "Get better: my mood and my depression"  Discharge Plan or Barriers:   Reason for Continuation of  Hospitalization: Depression Medication stabilization  Estimated Length of Stay: 2-4 days.  Attendees: Patient:Tamara Rodriguez 03/20/2018   Physician: Dr Jama Flavors, MD 03/20/2018   Nursing: Quintella Reichert, RN 03/20/2018   RN Care Manager: 03/20/2018   Social Worker: Daleen Squibb, LCSW 03/20/2018   Recreational Therapist:  03/20/2018   Other:  03/20/2018   Other:  03/20/2018   Other: 03/20/2018        Scribe for Treatment Team: Lorri Frederick, LCSW 03/20/2018 3:20 PM

## 2018-03-20 NOTE — Progress Notes (Signed)
Recreation Therapy Notes  Date: 7.24.19 Time: 0930 Location: [address] Dayroom  Group Topic: Stress Management  Goal Area(s) Addresses:  Patient will verbalize importance of using healthy stress management.  Patient will identify positive emotions associated with healthy stress management.   Intervention: Stress Management  Activity : Meditation.  LRT introduced the stress management technique of meditation.  LRT played a meditation that focused on choices.  Patients were to follow along as meditation was read to fully engage in the activity.  Education:  Stress Management, Discharge Planning.   Education Outcome: Acknowledges edcuation/In group clarification offered/Needs additional education  Clinical Observations/Feedback: Pt did not attend group.    Caroll RancherMarjette Kenosha Doster, LRT/CTRS         Lillia AbedLindsay, Maura Braaten A 03/20/2018 11:00 AM

## 2018-03-20 NOTE — Progress Notes (Signed)
Patient ID: Tamara Rodriguez, female   DOB: 1996/05/27, 22 y.o.   MRN: [id number]   D: Patient was pleasant on approach tonight. Reports her mood is improved from admission. Interacting and laughing with peers in the dayroom tonight. Currently denies any SI and contracts for safety on the unit. A: Staff will monitor on q 15 minute checks, follow treatment plan, and give medications as ordered. R: Cooperative on the unit. Took trazodone for sleep.

## 2018-03-20 NOTE — [facility] Group Notes (Signed)
[facility] Group Therapy 03/20/2018 1:15pm  Type of Therapy: Mental Health Association Presentation  Participation Level: Active  Participation Quality: Attentive  Affect: Appropriate  Cognitive: Oriented  Insight: Developing/Improving  Engagement in Therapy: Engaged  Modes of Intervention: Discussion, Education and Socialization  Summary of Progress/Problems: Mental Health Association (MHA) Speaker came to talk about his personal journey with mental health. The pt processed ways by which to relate to the speaker. MHA speaker provided handouts and educational information pertaining to groups and services offered by the MHA. Pt was engaged in speaker's presentation and was receptive to resources provided.    Arville Postlewaite Jon, LCSW 03/20/2018 12:24 PM 

## 2018-03-20 NOTE — Plan of Care (Signed)
  Problem: Activity: Goal: Interest or engagement in activities will improve Outcome: Progressing   Problem: Coping: Goal: Ability to verbalize frustrations and anger appropriately will improve Outcome: Progressing   Problem: Safety: Goal: Periods of time without injury will increase Outcome: Progressing   DAR NOTE: Patient presents with calm affect and pleasant mood mood.  Pt has been observed in the dayroom interacting peers and staff well. Pt reports good night sleep, good appetite normal energy and good concentration. Denies pain, auditory and visual hallucinations.  Rates depression at 0, hopelessness at 0, and anxiety at 1.  Maintained on routine safety checks.  Medications given as prescribed.  Support and encouragement offered as needed.  States goal for today is "socialize mor like I did yesterday." Patient observed socializing with peers in the dayroom.  Offered no complaint.

## 2018-03-20 NOTE — [facility] Group Notes (Signed)
[facility] Group Notes:  (Nursing/MHT/Case Management/Adjunct)  Date:  03/20/2018  Time:   1:15 pm  Type of Therapy:  Psychoeducational Skills  Participation Level:  Active  Participation Quality:  Appropriate  Affect:  Appropriate  Cognitive:  Appropriate  Insight:  Appropriate  Engagement in Group:  Engaged  Modes of Intervention:  Discussion and Education  Summary of Progress/Problems:  Patient participated appropriately in group.  Quintella ReichertKnight, Javae Braaten [place] 03/20/2018, 2:23 PM

## 2018-03-20 NOTE — Therapy (Signed)
Occupational Therapy Group Note  Date:  03/20/2018 Time:  2:45 PM  Group Topic/Focus:  Stress Management  Participation Level:  Active  Participation Quality:  Appropriate  Affect:  Flat  Cognitive:  Appropriate  Insight: Improving  Engagement in Group:  Engaged  Modes of Intervention:  Activity, Discussion, Education and Socialization  Additional Comments:    S: "I listen to a lot of music"   O: Stress management group completed to use as productive coping strategy, to help mitigate maladaptive coping to integrate in functional BADL/IADL. Stress management tool worksheet discussed to educate on unhealthy vs healthy coping skills to manage stress to improve community integration. Coping strategies taught include: relaxation based- deep breathing, counting to 10, taking a 1 minute vacation, acceptance, stress balls, relaxation audio/video, visual/mental imagery. Positive mental attitude- gratitude, acceptance, cognitive reframing, positive self talk, anger management. Coping skills bingo played with education given on variety of coping skills between bingo calls. Pts encouraged to share experience with various coping skills and share what has worked for them with others. Coloring given at the end of the session.   A: Pt presents to group with flat affect, engaged and participatory- but quiet in discussion. Stress management tools worksheet completed. Pt engaged in coping skills bingo with much enjoyment and socialization with other group members. Pt states she enjoys listening to music to manage stress this date.  P: Pt provided with education on stress management activities to implement into daily routine. Handouts given to facilitate carryover when reintegrating into comm   [facility] RidgeKaylee Koltin Wehmeyer, MSOT, OTR/L  AvnetKaylee Mattson Dayal 03/20/2018, 2:45 PM

## 2018-03-21 ENCOUNTER — Encounter (HOSPITAL_COMMUNITY): Payer: Self-pay | Admitting: Behavioral Health

## 2018-03-21 DIAGNOSIS — F331 Major depressive disorder, recurrent, moderate: Secondary | ICD-10-CM

## 2018-03-21 MED ORDER — CITALOPRAM HYDROBROMIDE 10 MG PO TABS: 10.0000 mg | ORAL_TABLET | Freq: Every day | ORAL | 0 refills | Status: DC

## 2018-03-21 MED ORDER — HYDROXYZINE HCL 25 MG PO TABS: 25.0000 mg | ORAL_TABLET | Freq: Three times a day (TID) | ORAL | 0 refills | Status: DC | PRN

## 2018-03-21 MED ORDER — TRAZODONE HCL 50 MG PO TABS: 50.0000 mg | ORAL_TABLET | Freq: Every evening | ORAL | 0 refills | Status: DC | PRN

## 2018-03-21 NOTE — Progress Notes (Signed)
Pt attended goals and orientation group this morning. Pt participated and was active.   

## 2018-03-21 NOTE — Progress Notes (Signed)
Patient discharged to lobby. Patient was stable and appreciative at that time. All papers and prescriptions were given and valuables returned. Verbal understanding expressed. Denies SI/HI and A/VH. Patient given opportunity to express concerns and ask questions.  

## 2018-03-21 NOTE — Discharge Summary (Addendum)
Physician Discharge Summary Note  Patient:  Tamara Rodriguez is an 22 y.o., female MRN:  [id number] DOB:  07/20/1996 Patient phone:  [phone] (home)  Patient address:   [address] [place] [address],  Total Time spent with patient: 30 minutes  Date of Admission:  03/18/2018 Date of Discharge: 03/21/2018  Reason for Admission:  Depression, intermittent SI  Principal Problem: MDD (major depressive disorder), recurrent episode, moderate (HCC) Discharge Diagnoses: Patient Active Problem List   Diagnosis Date Noted  . MDD (major depressive disorder), recurrent episode, moderate (HCC) [F33.1]   . Depression [F32.9] 03/18/2018  . Chlamydia [A74.9] 11/29/2015  . Irregular menses [N92.6] 11/26/2015  . Encounter for Depo-Provera contraception [Z30.42] 11/26/2015  . Right ankle sprain [S93.401A] 05/16/2011    Past Psychiatric History: denies prior psychiatric admissions, denies prior history of suicidal attempts, denies history of self cutting or self injurious ideations, no history of psychosis, denies history of mania/hypomania, denies history of PTSD. Denies history of violence .  Describes history of depression, worse since she started college two years ago, which she states waxes and wanes- also identifies seasonal component, states it tends to be worse during Spring time. Denies panic or agoraphobia, describes some social anxiety symptoms. States she has not been treated with psychiatric medications    Past Medical History: History reviewed. No pertinent past medical history. History reviewed. No pertinent surgical history. Family History:  Family History  Problem Relation Age of Onset  . Diabetes Father   . Hypertension Father   . Asthma Mother   . Hypertension Maternal Aunt   . Cancer Maternal Aunt   . Hypertension Maternal Uncle   . Diabetes Paternal Aunt   . Diabetes Paternal Uncle   . Hypertension Maternal Grandmother   . Cancer Maternal Grandmother   .  Hypertension Paternal Grandmother   . Heart attack Neg Hx   . Hyperlipidemia Neg Hx   . Sudden death Neg Hx    Family Psychiatric  History: history of depression in paternal extended family, father has history of alcohol abuse , no suicides in family.    Social History:  Social History   Substance and Sexual Activity  Alcohol Use No  . Alcohol/week: 0.0 oz     Social History   Substance and Sexual Activity  Drug Use No    Social History   Socioeconomic History  . Marital status: Single    Spouse name: Not on file  . Number of children: Not on file  . Years of education: Not on file  . Highest education level: Not on file  Occupational History  . Not on file  Social Needs  . Financial resource strain: Not on file  . Food insecurity:    Worry: Not on file    Inability: Not on file  . Transportation needs:    Medical: Not on file    Non-medical: Not on file  Tobacco Use  . Smoking status: Never Smoker  . Smokeless tobacco: Never Used  Substance and Sexual Activity  . Alcohol use: No    Alcohol/week: 0.0 oz  . Drug use: No  . Sexual activity: Not Currently    Birth control/protection: Injection    Comment: proctected sex a while ago   Lifestyle  . Physical activity:    Days per week: Not on file    Minutes per session: Not on file  . Stress: Not on file  Relationships  . Social connections:    Talks on phone: Not on  file    Gets together: Not on file    Attends religious service: Not on file    Active member of club or organization: Not on file    Attends meetings of clubs or organizations: Not on file    Relationship status: Not on file  Other Topics Concern  . Not on file  Social History Narrative  . Not on file    Hospital Course:  22 year old single  female, lives with mother, employed, in college. States she has a history of depression, which she states waxes and wanes ( although states " it gets better but does not go away completely)   particularly since she started college two years ago. She states she was experiencing intermittent suicidal ideations, described as passive ( " like not wanting to be here, wishing I would not wake up" ) but on day of admission developed thoughts of hanging self on day of admission triggered by an argument with her mother. States she actually set a noose with an extension cord , but decided against it and told her therapist, who recommended inpatient admission. Endorses neuro -vegetative symptoms  Tamara Rodriguez was started on medication regimen for presenting symptoms. She was medicated & discharged on; Celexa 10 mg daily for depression and anxiety,  Vistaril 25 mg every 8 hours PRN for anxiety as needed, Trazodone 50 mg nightly PRN for insomnia as needed. Patient has been adherent with treatment recommendations. Patient tolerated the medications without any reported side effects are adverse reactions.  Patient was enrolled & participated in the group counseling sessions being offerred & held on this unit. Patient learned coping skills.  Labs: TSH normal. Pregnancy and UDS negative. Ethanol no toxicity. CBC hemoglobin 15.5 and HCT 46.6 otherwise normal.   Tamara Rodriguez is seen today by the attending psychiatrist for discharge. Patient denies any delusions, no hallucinations or other psychotic process. Patient denies active or passive suicidal thoughts. No thoughts of violence.  Endorses overall improvement in mood emotional state.    Nursing staff reports that patient has been appropriate on the unit. Patient has been interacting well with peers. No behavioral issues. Patient has not voiced any suicidal thoughts prior to discharge. Patient was discussed at the treatment team meeting this morning. Team members feels that patient is back to his baseline level of functioning. Team agrees with plan to discharge patient today. Patient was provided with all follow-up information to resume mental health treatment following  discharge as noted below.Tamara Rodriguez [facility] with [facility].  [facility] in no apparent distress. Transportation per patient/ family arrangement.    Physical Findings: AIMS: Facial and Oral Movements Muscles of Facial Expression: None, normal Lips and Perioral Area: None, normal Jaw: None, normal Tongue: None, normal,Extremity Movements Upper (arms, wrists, hands, fingers): None, normal Lower (legs, knees, ankles, toes): None, normal, Trunk Movements Neck, shoulders, hips: None, normal, Overall Severity Severity of abnormal movements (highest score from questions above): None, normal Incapacitation due to abnormal movements: None, normal Patient's awareness of abnormal movements (rate only patient's report): No Awareness, Dental Status Current problems with teeth and/or dentures?: No Does patient usually wear dentures?: No  CIWA:    COWS:     Musculoskeletal: Strength & Muscle Tone: within normal limits Gait & Station: normal Patient leans: N/A  Psychiatric Specialty Exam: SEE SRA BY MD  Physical Exam  Nursing note and vitals reviewed. Constitutional: She is oriented to person, place, and time.  Neurological: She is alert and oriented to person, place, and time.    Review of Systems  Psychiatric/Behavioral: Negative for hallucinations, memory loss, substance abuse and suicidal ideas. Depression: improved. Nervous/anxious: improved. Insomnia: improved.   All other systems reviewed and are negative.   Blood pressure 101/61, pulse 90, temperature 98.7 F (37.1 C), temperature source Oral, resp. rate 16, height 5\' 1"  (1.549 m), weight 49.9 kg (110 lb).Body mass index is 20.78 kg/m.    Have you used any form of tobacco in the last 30 days? (Cigarettes, Smokeless Tobacco, Cigars, and/or Pipes): No  Has this patient used any form of tobacco in the last 30 days? (Cigarettes, Smokeless Tobacco, Cigars, and/or  Pipes)  N/A  Blood Alcohol level:  Lab Results  Component Value Date   ETH <10 03/18/2018    Metabolic Disorder Labs:  No results found for: HGBA1C, MPG Lab Results  Component Value Date   PROLACTIN 7.1 11/26/2015   No results found for: CHOL, TRIG, HDL, CHOLHDL, VLDL, LDLCALC  See Psychiatric Specialty Exam and Suicide Risk Assessment completed by Attending Physician prior to discharge.  Discharge destination:  Home  Is patient on multiple antipsychotic therapies at discharge:  No   Has Patient had three or more failed trials of antipsychotic monotherapy by history:  No  Recommended Plan for Multiple Antipsychotic Therapies: NA   Allergies as of 03/21/2018      Reactions   Latex Hives      Medication List    TAKE these medications     Indication  citalopram 10 MG tablet Commonly known as:  CELEXA Take 1 tablet (10 mg total) by mouth daily. Start taking on:  03/22/2018  Indication:  Depression   hydrOXYzine 25 MG tablet Commonly known as:  ATARAX/VISTARIL Take 1 tablet (25 mg total) by mouth 3 (three) times daily as needed for anxiety.  Indication:  Feeling Anxious   traZODone 50 MG tablet Commonly known as:  DESYREL Take 1 tablet (50 mg total) by mouth at bedtime as needed for sleep.  Indication:  Trouble Sleeping      Follow-up Information    [facility]. Go in 3 day(s).   Why:  Please attend a walk in appt.  Please request therapy and medication management services. Contact information: [address], [place] [address] P: [phone] F: [phone]          Follow-up recommendations: Follow up with your outpatient provided for any medical issues. Activity & diet as recommended by your primary care provider.  Comments:  Patient is instructed prior to discharge to: Take all medications as prescribed by his/her mental healthcare provider. Report any adverse effects and or reactions from the medicines to his/her outpatient  provider promptly. Patient has been instructed & cautioned: To not engage in alcohol and or illegal drug use while on prescription medicines. In the event of worsening symptoms, patient is instructed to call the crisis hotline, 911 and or go to the nearest ED for appropriate evaluation and treatment of symptoms. To follow-up with his/her primary care provider for your other medical issues, concerns and or health care needs.  Signed: Denzil Magnuson, NP 03/21/2018, 11:36 AM   Patient seen, Suicide Assessment Completed.  Disposition Plan Reviewed

## 2018-03-21 NOTE — [facility] Suicide Risk Assessment (Signed)
[facility] Discharge Suicide Risk Assessment   Principal Problem: depression Discharge Diagnoses:  Patient Active Problem List   Diagnosis Date Noted  . Depression [F32.9] 03/18/2018  . Chlamydia [A74.9] 11/29/2015  . Irregular menses [N92.6] 11/26/2015  . Encounter for Depo-Provera contraception [Z30.42] 11/26/2015  . Right ankle sprain [S93.401A] 05/16/2011    Total Time spent with patient: 30 minutes  Musculoskeletal: Strength & Muscle Tone: within normal limits Gait & Station: normal Patient leans: N/A  Psychiatric Specialty Exam: ROS no headache, no chest pain , no shortness of breath, no nausea or vomiting , no diarrhea , no rash  Blood pressure 101/61, pulse 90, temperature 98.7 F (37.1 C), temperature source Oral, resp. rate 16, height 5\' 1"  (1.549 m), weight 49.9 kg (110 lb).Body mass index is 20.78 kg/m.  General Appearance: Well Groomed  Eye Contact::  Good  Speech:  Normal Rate409  Volume:  Normal  Mood:  reports improving mood, states she feels " a lot better"  Affect:  Appropriate  Thought Process:  Linear and Descriptions of Associations: Intact  Orientation:  Other:  fully alert and attentive   Thought Content:  no hallucinations, no delusions, not internally preoccupied   Suicidal Thoughts:  No denies suicidal or self injurious ideations  Homicidal Thoughts:  No denies any homicidal or violent ideations  Memory:  recent and remote grossly intact  Judgement:  Other:  improving   Insight:  improving   Psychomotor Activity:  Normal  Concentration:  Good  Recall:  Good  Fund of Knowledge:Good  Language: Good  Akathisia:  Negative  Handed:  Right  AIMS (if indicated):     Assets:  Communication Skills Desire for Improvement Resilience  Sleep:  Number of Hours: 6.75  Cognition: WNL  ADL's:  Intact   Mental Status Per Nursing Assessment::   On Admission:  Suicidal ideation indicated by patient, Self-harm thoughts  Demographic Factors:  22 year old single  female, lives with mother, in college , currently employed   Loss Factors: Relationship stressors  Historical Factors: History of depression, no prior history of suicide attempts, no history of self cutting   Risk Reduction Factors:   Sense of responsibility to family, Employed, Living with another person, especially a relative, Positive social support and Positive coping skills or problem solving skills  Continued Clinical Symptoms:  Alert and attentive, well related, calm, reports mood as much improved and affect is more reactive, no thought disorder, no suicidal or self injurious ideations, no homicidal or violent ideations, no hallucinations,no delusions, not internally preoccupied, future oriented . Behavior on unit calm and in good control Denies medication side effects We reviewed medication side effects, including potential risk of increased suicidal ideations early in treatment with antidepressant medications in young adults . At her request and in her presence I spoke with her mother, who corroborates patient is improved and who agrees with discharge today.   Cognitive Features That Contribute To Risk:  No gross cognitive deficits noted upon discharge. Is alert , attentive, and oriented x 3   Suicide Risk:  Mild:  Suicidal ideation of limited frequency, intensity, duration, and specificity.  There are no identifiable plans, no associated intent, mild dysphoria and related symptoms, good self-control (both objective and subjective assessment), few other risk factors, and identifiable protective factors, including available and accessible social support.    Plan Of Care/Follow-up recommendations:  Activity:  as tolerated  Diet:  regular Tests:  NA Other:  see below Patient is requesting discharge and there  are no current grounds for involuntary commitment - leaving unit in good spirits Plans to follow up for outpatient psychiatric management. Plans to return home Craige CottaFernando A  Cobos, MD 03/21/2018, 8:11 AM

## 2018-03-21 NOTE — Progress Notes (Signed)
  [facility] :  Will you be returning to the same living situation after discharge:  Yes,  with parent At discharge, do you have transportation home?: Yes,  own car Do you have the ability to pay for your medications: Yes,  [employer]  Release of information consent forms completed and in the chart;  Patient's signature needed at discharge.  Patient to Follow up at: Follow-up Information    [facility]. Go in 3 day(s).   Why:  Please attend a walk in appt.  Please request therapy and medication management services. Contact information: [address] [place], [place] [address] P: [phone] F: [phone]          Next level of care provider has access to [facility]:no  Safety Planning and Suicide Prevention discussed: No. Pt declined consent.  Have you used any form of tobacco in the last 30 days? (Cigarettes, Smokeless Tobacco, Cigars, and/or Pipes): No  Has patient been referred to the Quitline?: N/A patient is not a smoker  Patient has been referred for addiction treatment: N/A  Lorri FrederickWierda, Dagmar Adcox Jon, LCSW 03/21/2018, 10:40 AM

## 2018-05-10 ENCOUNTER — Encounter: Payer: Self-pay | Admitting: Family

## 2018-05-10 ENCOUNTER — Encounter: Payer: Self-pay | Admitting: General Practice

## 2018-05-10 ENCOUNTER — Ambulatory Visit (INDEPENDENT_AMBULATORY_CARE_PROVIDER_SITE_OTHER): Payer: 59 | Admitting: Family

## 2018-05-10 VITALS — BP 85/57 | HR 81 | Ht 60.34 in | Wt 107.0 lb

## 2018-05-10 DIAGNOSIS — Z30011 Encounter for initial prescription of contraceptive pills: Secondary | ICD-10-CM

## 2018-05-10 DIAGNOSIS — Z3202 Encounter for pregnancy test, result negative: Secondary | ICD-10-CM | POA: Diagnosis not present

## 2018-05-10 DIAGNOSIS — Z113 Encounter for screening for infections with a predominantly sexual mode of transmission: Secondary | ICD-10-CM

## 2018-05-10 DIAGNOSIS — R69 Illness, unspecified: Secondary | ICD-10-CM | POA: Diagnosis not present

## 2018-05-10 LAB — POCT URINE PREGNANCY: PREG TEST UR: NEGATIVE

## 2018-05-10 MED ORDER — NORETHINDRONE ACET-ETHINYL EST 1.5-30 MG-MCG PO TABS: 1.0000 | ORAL_TABLET | Freq: Every day | ORAL | 11 refills | Status: DC

## 2018-05-10 NOTE — Patient Instructions (Signed)
Oral Contraception Information Oral contraceptive pills (OCPs) are medicines taken to prevent pregnancy. OCPs work by preventing the ovaries from releasing eggs. The hormones in OCPs also cause the cervical mucus to thicken, preventing the sperm from entering the uterus. The hormones also cause the uterine lining to become thin, not allowing a fertilized egg to attach to the inside of the uterus. OCPs are highly effective when taken exactly as prescribed. However, OCPs do not prevent sexually transmitted diseases (STDs). Safe sex practices, such as using condoms along with the pill, can help prevent STDs. Before taking the pill, you may have a physical exam and Pap test. Your health care provider may order blood tests. The health care provider will make sure you are a good candidate for oral contraception. Discuss with your health care provider the possible side effects of the OCP you may be prescribed. When starting an OCP, it can take 2 to 3 months for the body to adjust to the changes in hormone levels in your body. Types of oral contraception  The combination pill-This pill contains estrogen and progestin (synthetic progesterone) hormones. The combination pill comes in 21-day, 28-day, or 91-day packs. Some types of combination pills are meant to be taken continuously (365-day pills). With 21-day packs, you do not take pills for 7 days after the last pill. With 28-day packs, the pill is taken every day. The last 7 pills are without hormones. Certain types of pills have more than 21 hormone-containing pills. With 91-day packs, the first 84 pills contain both hormones, and the last 7 pills contain no hormones or contain estrogen only.  The minipill-This pill contains the progesterone hormone only. The pill is taken every day continuously. It is very important to take the pill at the same time each day. The minipill comes in packs of 28 pills. All 28 pills contain the hormone. Advantages of oral  contraceptive pills  Decreases premenstrual symptoms.  Treats menstrual period cramps.  Regulates the menstrual cycle.  Decreases a heavy menstrual flow.  May treatacne, depending on the type of pill.  Treats abnormal uterine bleeding.  Treats polycystic ovarian syndrome.  Treats endometriosis.  Can be used as emergency contraception. Things that can make oral contraceptive pills less effective OCPs can be less effective if:  You forget to take the pill at the same time every day.  You have a stomach or intestinal disease that lessens the absorption of the pill.  You take OCPs with other medicines that make OCPs less effective, such as antibiotics, certain HIV medicines, and some seizure medicines.  You take expired OCPs.  You forget to restart the pill on day 7, when using the packs of 21 pills.  Risks associated with oral contraceptive pills Oral contraceptive pills can sometimes cause side effects, such as:  Headache.  Nausea.  Breast tenderness.  Irregular bleeding or spotting.  Combination pills are also associated with a small increased risk of:  Blood clots.  Heart attack.  Stroke.  This information is not intended to replace advice given to you by your health care provider. Make sure you discuss any questions you have with your health care provider. Document Released: 11/04/2002 Document Revised: 01/20/2016 Document Reviewed: 02/02/2013 Elsevier Interactive Patient Education  2018 Elsevier Inc.  

## 2018-05-10 NOTE — Progress Notes (Signed)
History was provided by the patient.  Tamara Rodriguez is a 22 y.o. female who is here for birth control initiation. She was previously on Depo.    PCP confirmed? No.  Patient, No Pcp Per  HPI:   -LMP end of August -had depo before  -interested in OCPs, no contraindications to estrogen (no migraine with aura, no cancers, no DVT/PE, no clotting disorder)  -no vaginal discharge changes, no vaginal lesions, no painful intercourse, no pelvic or abdominal pain.  -when on Depo, only had cycle when she was almost at the end or had missed her window. Mom told her that it was not normal to not have a period.  Review of Systems  Constitutional: Negative for malaise/fatigue.  Eyes: Negative for double vision.  Respiratory: Negative for shortness of breath.   Cardiovascular: Negative for chest pain and palpitations.  Gastrointestinal: Negative for abdominal pain, constipation, diarrhea, nausea and vomiting.  Genitourinary: Negative for dysuria.  Musculoskeletal: Negative for joint pain and myalgias.  Skin: Negative for rash.  Neurological: Negative for dizziness and headaches.  Endo/Heme/Allergies: Does not bruise/bleed easily.     Patient Active Problem List   Diagnosis Date Noted  . MDD (major depressive disorder), recurrent episode, moderate (HCC)   . Depression 03/18/2018  . Chlamydia 11/29/2015  . Irregular menses 11/26/2015  . Encounter for Depo-Provera contraception 11/26/2015  . Right ankle sprain 05/16/2011    Current Outpatient Medications on File Prior to Visit  Medication Sig Dispense Refill  . citalopram (CELEXA) 10 MG tablet Take 1 tablet (10 mg total) by mouth daily. (Patient not taking: Reported on 05/10/2018) 30 tablet 0  . hydrOXYzine (ATARAX/VISTARIL) 25 MG tablet Take 1 tablet (25 mg total) by mouth 3 (three) times daily as needed for anxiety. (Patient not taking: Reported on 05/10/2018) 30 tablet 0  . traZODone (DESYREL) 50 MG tablet Take 1 tablet (50 mg total) by mouth  at bedtime as needed for sleep. (Patient not taking: Reported on 05/10/2018) 30 tablet 0   No current facility-administered medications on file prior to visit.     Allergies  Allergen Reactions  . Latex Hives    Physical Exam:    Vitals:   05/10/18 1010  BP: (!) 85/57  Pulse: 81  Weight: 107 lb (48.5 kg)  Height: 5' 0.34" (1.533 m)    Growth percentile SmartLinks can only be used for patients less than 355 years old. No LMP recorded. Patient has had an injection.  Physical Exam  Constitutional: She is oriented to person, place, and time. She appears well-developed and well-nourished. No distress.  Eyes: Pupils are equal, round, and reactive to light. EOM are normal. No scleral icterus.  Neck: Normal range of motion.  Cardiovascular: Normal rate and regular rhythm.  No murmur heard. Pulmonary/Chest: Effort normal.  Abdominal: Soft. There is no guarding.  Musculoskeletal: Normal range of motion.  Lymphadenopathy:    She has no cervical adenopathy.  Neurological: She is alert and oriented to person, place, and time.  Skin: Skin is warm and dry. No rash noted.  Psychiatric: She has a normal mood and affect.  Nursing note and vitals reviewed.   Assessment/Plan: 1. Encounter for BCP (birth control pills) initial prescription -Reviewed Tier 1 and Tier 2 contraceptive methods, IUD implant, depo, pill, patch, ring - she elects to start OCPs.  -Reviewed that it is more concerning when women skip a period without hormonal contraceptive method than when the period is altered by birth control method, explained hyperplasia and  risks for uterine and endometrial malignancies. She voiced understanding -reviewed condoms and EC   2. Negative pregnancy test -negative - POCT urine pregnancy  3. Routine screening for STI (sexually transmitted infection) -per protocol  - GC Probe amplification, urine

## 2018-05-11 LAB — C. TRACHOMATIS/N. GONORRHOEAE RNA
C. trachomatis RNA, TMA: NOT DETECTED
N. GONORRHOEAE RNA, TMA: NOT DETECTED

## 2018-05-13 ENCOUNTER — Other Ambulatory Visit: Payer: Self-pay

## 2018-05-13 ENCOUNTER — Encounter (HOSPITAL_COMMUNITY): Payer: Self-pay | Admitting: *Deleted

## 2018-05-13 ENCOUNTER — Emergency Department (HOSPITAL_COMMUNITY)
Admission: EM | Admit: 2018-05-13 | Discharge: 2018-05-13 | Disposition: A | Discharge status: Home / Self Care | Payer: 59 | Attending: Emergency Medicine | Admitting: Emergency Medicine

## 2018-05-13 DIAGNOSIS — Z79899 Other long term (current) drug therapy: Secondary | ICD-10-CM | POA: Diagnosis not present

## 2018-05-13 DIAGNOSIS — L0231 Cutaneous abscess of buttock: Secondary | ICD-10-CM | POA: Insufficient documentation

## 2018-05-13 DIAGNOSIS — Z91040 Latex allergy status: Secondary | ICD-10-CM | POA: Diagnosis not present

## 2018-05-13 DIAGNOSIS — L0291 Cutaneous abscess, unspecified: Secondary | ICD-10-CM

## 2018-05-13 MED ORDER — SULFAMETHOXAZOLE-TRIMETHOPRIM 800-160 MG PO TABS: 1.0000 | ORAL_TABLET | Freq: Two times a day (BID) | ORAL | 0 refills | Status: DC

## 2018-05-13 MED ORDER — LIDOCAINE-EPINEPHRINE (PF) 2 %-1:200000 IJ SOLN
10.0000 mL | Freq: Once | INTRAMUSCULAR | Status: AC
  Administered 2018-05-13: 10 mL
  Filled 2018-05-13: qty 20

## 2018-05-13 MED ORDER — TRAMADOL HCL 50 MG PO TABS
50.0000 mg | ORAL_TABLET | Freq: Four times a day (QID) | ORAL | 0 refills | Status: DC | PRN
Start: 2018-05-13 — End: 2018-05-15

## 2018-05-13 NOTE — ED Provider Notes (Signed)
[facility]-EMERGENCY DEPT Provider Note   CSN: [id number] Arrival date & time: 05/13/18  0117     History   Chief Complaint Chief Complaint  Patient presents with  . Abscess    HPI Tamara Rodriguez is a 22 y.o. female.  Patient presents to the ER for evaluation of abscess on her left buttock.  Symptoms began over the last couple of days, area has become more painful and swollen.  She denies fever.  There has not been any drainage.     History reviewed. No pertinent past medical history.  Patient Active Problem List   Diagnosis Date Noted  . MDD (major depressive disorder), recurrent episode, moderate (HCC)   . Depression 03/18/2018  . Chlamydia 11/29/2015  . Irregular menses 11/26/2015  . Encounter for Depo-Provera contraception 11/26/2015  . Right ankle sprain 05/16/2011    History reviewed. No pertinent surgical history.   OB History   None      Home Medications    Prior to Admission medications   Medication Sig Start Date End Date Taking? Authorizing Provider  Norethindrone Acetate-Ethinyl Estradiol (JUNEL,LOESTRIN,MICROGESTIN) 1.5-30 MG-MCG tablet Take 1 tablet by mouth daily. 05/10/18  Yes Millican, Neysa Bonitohristy, NP  citalopram (CELEXA) 10 MG tablet Take 1 tablet (10 mg total) by mouth daily. Patient not taking: Reported on 05/10/2018 03/22/18   Denzil Magnusonhomas, Lashunda, NP  hydrOXYzine (ATARAX/VISTARIL) 25 MG tablet Take 1 tablet (25 mg total) by mouth 3 (three) times daily as needed for anxiety. Patient not taking: Reported on 05/10/2018 03/21/18   Denzil Magnusonhomas, Lashunda, NP  sulfamethoxazole-trimethoprim (BACTRIM DS,SEPTRA DS) 800-160 MG tablet Take 1 tablet by mouth 2 (two) times daily. 05/13/18   Gilda CreasePollina, Kemberly Taves J, MD  traMADol (ULTRAM) 50 MG tablet Take 1 tablet (50 mg total) by mouth every 6 (six) hours as needed. 05/13/18   Gilda CreasePollina, Aayana Reinertsen J, MD  traZODone (DESYREL) 50 MG tablet Take 1 tablet (50 mg total) by mouth at bedtime as needed for  sleep. Patient not taking: Reported on 05/10/2018 03/21/18   Denzil Magnusonhomas, Lashunda, NP    Family History Family History  Problem Relation Age of Onset  . Diabetes Father   . Hypertension Father   . Asthma Mother   . Hypertension Maternal Aunt   . Cancer Maternal Aunt   . Hypertension Maternal Uncle   . Diabetes Paternal Aunt   . Diabetes Paternal Uncle   . Hypertension Maternal Grandmother   . Cancer Maternal Grandmother   . Hypertension Paternal Grandmother   . Heart attack Neg Hx   . Hyperlipidemia Neg Hx   . Sudden death Neg Hx     Social History Social History   Tobacco Use  . Smoking status: Never Smoker  . Smokeless tobacco: Never Used  Substance Use Topics  . Alcohol use: No    Alcohol/week: 0.0 standard drinks  . Drug use: No     Allergies   Latex   Review of Systems Review of Systems  Constitutional: Negative for fever.  Skin: Positive for wound.  All other systems reviewed and are negative.    Physical Exam Updated Vital Signs BP 114/72 (BP Location: Right Arm)   Pulse (!) 101   Temp 98.2 F (36.8 C)   Resp 14   SpO2 100%   Physical Exam  Constitutional: She is oriented to person, place, and time. She appears well-developed and well-nourished.  Eyes: Pupils are equal, round, and reactive to light.  Cardiovascular: Normal rate.  Pulmonary/Chest: Effort normal and breath  sounds normal.  Musculoskeletal: Normal range of motion.  Neurological: She is alert and oriented to person, place, and time.  Skin:  2 cm tender, indurated, fluctuant area left medial buttock  Psychiatric: She has a normal mood and affect.     ED Treatments / Results  Labs (all labs ordered are listed, but only abnormal results are displayed) Labs Reviewed - No data to display  EKG None  Radiology No results found.  Procedures .Marland KitchenIncision and Drainage Date/Time: 05/13/2018 6:21 AM Performed by: Gilda Crease, MD Authorized by: Gilda Crease, MD    Consent:    Consent obtained:  Verbal   Consent given by:  Patient   Risks discussed:  Bleeding, incomplete drainage and pain Universal protocol:    Procedure explained and questions answered to patient or proxy's satisfaction: yes     Site/side marked: yes     Immediately prior to procedure a time out was called: yes     Patient identity confirmed:  Verbally with patient Location:    Type:  Abscess   Size:  2cm   Location:  Anogenital   Anogenital location: Medial buttock. Pre-procedure details:    Skin preparation:  Betadine Anesthesia (see MAR for exact dosages):    Anesthesia method:  Local infiltration   Local anesthetic:  Lidocaine 2% WITH epi Procedure type:    Complexity:  Simple Procedure details:    Incision types:  Single straight   Incision depth:  Subcutaneous   Scalpel blade:  11   Wound management:  Probed and deloculated and irrigated with saline   Drainage:  Purulent   Drainage amount:  Moderate   Wound treatment:  Wound left open Post-procedure details:    Patient tolerance of procedure:  Tolerated well, no immediate complications   (including critical care time)  Medications Ordered in ED Medications  lidocaine-EPINEPHrine (XYLOCAINE W/EPI) 2 %-1:200000 (PF) injection 10 mL (10 mLs Infiltration Given 05/13/18 0538)     Initial Impression / Assessment and Plan / ED Course  I have reviewed the triage vital signs and the nursing notes.  Pertinent labs & imaging results that were available during my care of the patient were reviewed by me and considered in my medical decision making (see chart for details).     Patient presents with abscess of left buttock.  There was fluctuance present, this area was drained.  She will be initiated on antibiotics.  Final Clinical Impressions(s) / ED Diagnoses   Final diagnoses:  Abscess    ED Discharge Orders         Ordered    traMADol (ULTRAM) 50 MG tablet  Every 6 hours PRN     05/13/18 0626     sulfamethoxazole-trimethoprim (BACTRIM DS,SEPTRA DS) 800-160 MG tablet  2 times daily     05/13/18 0626           Gilda Crease, MD 05/13/18 [phone]

## 2018-05-13 NOTE — ED Triage Notes (Signed)
Pt reports she noticed a small area on the left buttocks about 2-3 days ago that has become larger. She denies drainage.

## 2018-05-15 ENCOUNTER — Encounter (HOSPITAL_COMMUNITY): Payer: Self-pay | Admitting: Emergency Medicine

## 2018-05-15 ENCOUNTER — Emergency Department (HOSPITAL_COMMUNITY)
Admission: EM | Admit: 2018-05-15 | Discharge: 2018-05-15 | Disposition: A | Discharge status: Home / Self Care | Payer: 59 | Attending: Emergency Medicine | Admitting: Emergency Medicine

## 2018-05-15 DIAGNOSIS — L0231 Cutaneous abscess of buttock: Secondary | ICD-10-CM | POA: Insufficient documentation

## 2018-05-15 DIAGNOSIS — R112 Nausea with vomiting, unspecified: Secondary | ICD-10-CM | POA: Diagnosis not present

## 2018-05-15 LAB — COMPREHENSIVE METABOLIC PANEL
ALT: 10 U/L (ref 0–44)
AST: 19 U/L (ref 15–41)
Albumin: 3.8 g/dL (ref 3.5–5.0)
Alkaline Phosphatase: 80 U/L (ref 38–126)
Anion gap: 11 (ref 5–15)
BILIRUBIN TOTAL: 0.8 mg/dL (ref 0.3–1.2)
BUN: 13 mg/dL (ref 6–20)
CO2: 25 mmol/L (ref 22–32)
CREATININE: 1.06 mg/dL — AB (ref 0.44–1.00)
Calcium: 9.4 mg/dL (ref 8.9–10.3)
Chloride: 100 mmol/L (ref 98–111)
GFR calc Af Amer: >60 mL/min (ref >60)
Glucose, Bld: 98 mg/dL (ref 70–99)
Potassium: 4.4 mmol/L (ref 3.5–5.1)
Sodium: 136 mmol/L (ref 135–145)
TOTAL PROTEIN: 7.6 g/dL (ref 6.5–8.1)

## 2018-05-15 LAB — CBC WITH DIFFERENTIAL/PLATELET
BASOS ABS: 0 10*3/uL (ref 0.0–0.1)
BASOS PCT: 0 %
EOS ABS: 0.1 10*3/uL (ref 0.0–0.7)
EOS PCT: 2 %
HCT: 41.1 % (ref 36.0–46.0)
Hemoglobin: 13.7 g/dL (ref 12.0–15.0)
Lymphocytes Relative: 21 %
Lymphs Abs: 1.8 10*3/uL (ref 0.7–4.0)
MCH: 30.4 pg (ref 26.0–34.0)
MCHC: 33.3 g/dL (ref 30.0–36.0)
MCV: 91.3 fL (ref 78.0–100.0)
MONO ABS: 0.7 10*3/uL (ref 0.1–1.0)
Monocytes Relative: 8 %
Neutro Abs: 6 10*3/uL (ref 1.7–7.7)
Neutrophils Relative %: 69 %
Platelets: 230 10*3/uL (ref 150–400)
RBC: 4.5 MIL/uL (ref 3.87–5.11)
RDW: 13 % (ref 11.5–15.5)
WBC: 8.7 10*3/uL (ref 4.0–10.5)

## 2018-05-15 LAB — I-STAT BETA HCG BLOOD, ED (MC, WL, AP ONLY): I-stat hCG, quantitative: <5 m[IU]/mL (ref <5)

## 2018-05-15 LAB — LIPASE, BLOOD: LIPASE: 30 U/L (ref 11–51)

## 2018-05-15 MED ORDER — ONDANSETRON 4 MG PO TBDP: 4.0000 mg | ORAL_TABLET | Freq: Three times a day (TID) | ORAL | 0 refills | Status: DC | PRN

## 2018-05-15 MED ORDER — ONDANSETRON 8 MG PO TBDP
8.0000 mg | ORAL_TABLET | Freq: Once | ORAL | Status: AC
  Administered 2018-05-15: 8 mg via ORAL
  Filled 2018-05-15: qty 1

## 2018-05-15 MED ORDER — METOCLOPRAMIDE HCL 5 MG/ML IJ SOLN
10.0000 mg | INTRAMUSCULAR | Status: AC
Start: 2018-05-15 — End: 2018-05-15
  Administered 2018-05-15: 10 mg via INTRAVENOUS
  Filled 2018-05-15: qty 2

## 2018-05-15 MED ORDER — DOXYCYCLINE HYCLATE 100 MG PO CAPS: 100.0000 mg | ORAL_CAPSULE | Freq: Two times a day (BID) | ORAL | 0 refills | Status: DC

## 2018-05-15 NOTE — ED Triage Notes (Signed)
Patient here from home with complaints of nausea, vomiting x2 days. States that she was given antibiotics and pain meds after having abscess drained on 9/16 and has had n/v since.

## 2018-05-15 NOTE — ED Provider Notes (Signed)
[facility]-EMERGENCY DEPT Provider Note   CSN: [id number] Arrival date & time: 05/15/18  [id number]     History   Chief Complaint Chief Complaint  Patient presents with  . Nausea  . Emesis    HPI Tamara Rodriguez is a 22 y.o. female.  22yo F w/ h/o depression who p/w nausea and vomiting.  2 days ago the patient presented here for left buttock abscess that was drained.  She was sent home on Bactrim and pain medication.  She had an episode of vomiting after she got home but she thinks that it is because she took the antibiotic on an empty stomach.  She has been laying in bed most of the time since being home and taking it easy because she has nausea and generalized weakness when she gets up and moves around.  Last night she took a dose of pain medication and later had another episode of vomiting.  She has not had any vomiting this morning.  She denies any associated diarrhea or abdominal pain.  No urinary symptoms or fevers.  No sick contacts.  She has been soaking in warm baths for her abscess and feels like it is overall doing okay.  The history is provided by the patient.  Emesis      History reviewed. No pertinent past medical history.  Patient Active Problem List   Diagnosis Date Noted  . MDD (major depressive disorder), recurrent episode, moderate (HCC)   . Depression 03/18/2018  . Chlamydia 11/29/2015  . Irregular menses 11/26/2015  . Encounter for Depo-Provera contraception 11/26/2015  . Right ankle sprain 05/16/2011    History reviewed. No pertinent surgical history.   OB History   None      Home Medications    Prior to Admission medications   Medication Sig Start Date End Date Taking? Authorizing Provider  Norethindrone Acetate-Ethinyl Estradiol (JUNEL,LOESTRIN,MICROGESTIN) 1.5-30 MG-MCG tablet Take 1 tablet by mouth daily. 05/10/18  Yes Millican, Neysa Bonitohristy, NP  citalopram (CELEXA) 10 MG tablet Take 1 tablet (10 mg total) by mouth daily. Patient  not taking: Reported on 05/10/2018 03/22/18   Denzil Magnusonhomas, Lashunda, NP  doxycycline (VIBRAMYCIN) 100 MG capsule Take 1 capsule (100 mg total) by mouth 2 (two) times daily. 05/15/18   Little, Ambrose Finlandachel Morgan, MD  hydrOXYzine (ATARAX/VISTARIL) 25 MG tablet Take 1 tablet (25 mg total) by mouth 3 (three) times daily as needed for anxiety. Patient not taking: Reported on 05/10/2018 03/21/18   Denzil Magnusonhomas, Lashunda, NP  ondansetron (ZOFRAN ODT) 4 MG disintegrating tablet Take 1 tablet (4 mg total) by mouth every 8 (eight) hours as needed for nausea or vomiting. 05/15/18   Little, Ambrose Finlandachel Morgan, MD  traZODone (DESYREL) 50 MG tablet Take 1 tablet (50 mg total) by mouth at bedtime as needed for sleep. Patient not taking: Reported on 05/10/2018 03/21/18   Denzil Magnusonhomas, Lashunda, NP    Family History Family History  Problem Relation Age of Onset  . Diabetes Father   . Hypertension Father   . Asthma Mother   . Hypertension Maternal Aunt   . Cancer Maternal Aunt   . Hypertension Maternal Uncle   . Diabetes Paternal Aunt   . Diabetes Paternal Uncle   . Hypertension Maternal Grandmother   . Cancer Maternal Grandmother   . Hypertension Paternal Grandmother   . Heart attack Neg Hx   . Hyperlipidemia Neg Hx   . Sudden death Neg Hx     Social History Social History   Tobacco Use  .  Smoking status: Never Smoker  . Smokeless tobacco: Never Used  Substance Use Topics  . Alcohol use: No    Alcohol/week: 0.0 standard drinks  . Drug use: No     Allergies   Latex   Review of Systems Review of Systems  Gastrointestinal: Positive for vomiting.   All other systems reviewed and are negative except that which was mentioned in HPI   Physical Exam Updated Vital Signs BP 97/60   Pulse 68   Temp 98 F (36.7 C) (Oral)   Resp 16   SpO2 100%   Physical Exam  Constitutional: She is oriented to person, place, and time. She appears well-developed and well-nourished. No distress.  HENT:  Head: Normocephalic and  atraumatic.  Moist mucous membranes  Eyes: Conjunctivae are normal.  Neck: Neck supple.  Cardiovascular: Normal rate, regular rhythm and normal heart sounds.  No murmur heard. Pulmonary/Chest: Effort normal and breath sounds normal.  Abdominal: Soft. Bowel sounds are normal. She exhibits no distension. There is no tenderness.  Genitourinary:  Genitourinary Comments: L medial buttock abscess with small amount of purulent drainage, no surrounding erythema or induration  Musculoskeletal: She exhibits no edema.  Neurological: She is alert and oriented to person, place, and time.  Fluent speech  Skin: Skin is warm and dry.  Psychiatric: She has a normal mood and affect. Judgment normal.  Nursing note and vitals reviewed. Chaperone was present during exam.    ED Treatments / Results  Labs (all labs ordered are listed, but only abnormal results are displayed) Labs Reviewed  COMPREHENSIVE METABOLIC PANEL - Abnormal; Notable for the following components:      Result Value   Creatinine, Ser 1.06 (*)    All other components within normal limits  LIPASE, BLOOD  CBC WITH DIFFERENTIAL/PLATELET  I-STAT BETA HCG BLOOD, ED ([facility], [facility], [facility] ONLY)    EKG None  Radiology No results found.  Procedures Procedures (including critical care time)  Medications Ordered in ED Medications  ondansetron (ZOFRAN-ODT) disintegrating tablet 8 mg (8 mg Oral Given 05/15/18 0900)  metoCLOPramide (REGLAN) injection 10 mg (10 mg Intravenous Given 05/15/18 0958)     Initial Impression / Assessment and Plan / ED Course  I have reviewed the triage vital signs and the nursing notes.  Pertinent labs that were available during my care of the patient were reviewed by me and considered in my medical decision making (see chart for details).    Well appearing, normal VS, abd non-tender on exam.  Her I&D site appears to be healing appropriately. Her N/V seems to be close in timing to her medication doses, I suspect  she may be having intolerance 2/2 taking on empty stomach.   Lab work unremarkable.  After receiving above medications, she was able to drink orange juice with no problem.  Encouraged to switch to Tylenol and ibuprofen, will switch to doxycycline for better tolerance given location of abscess and continued purulent drainage.  Provided with Zofran to use as needed and encouraged continued hydration at home.  Return precautions reviewed.  Final Clinical Impressions(s) / ED Diagnoses   Final diagnoses:  Non-intractable vomiting with nausea, unspecified vomiting type    ED Discharge Orders         Ordered    doxycycline (VIBRAMYCIN) 100 MG capsule  2 times daily     05/15/18 1140    ondansetron (ZOFRAN ODT) 4 MG disintegrating tablet  Every 8 hours PRN     05/15/18 1140  Little, Ambrose Finland, MD 05/15/18 1145

## 2018-06-08 ENCOUNTER — Emergency Department (HOSPITAL_COMMUNITY)
Admission: EM | Admit: 2018-06-08 | Discharge: 2018-06-08 | Disposition: A | Discharge status: Home / Self Care | Payer: 59 | Attending: Emergency Medicine | Admitting: Emergency Medicine

## 2018-06-08 ENCOUNTER — Other Ambulatory Visit: Payer: Self-pay

## 2018-06-08 DIAGNOSIS — N739 Female pelvic inflammatory disease, unspecified: Secondary | ICD-10-CM | POA: Diagnosis not present

## 2018-06-08 DIAGNOSIS — Z202 Contact with and (suspected) exposure to infections with a predominantly sexual mode of transmission: Secondary | ICD-10-CM | POA: Insufficient documentation

## 2018-06-08 DIAGNOSIS — A6000 Herpesviral infection of urogenital system, unspecified: Secondary | ICD-10-CM | POA: Diagnosis not present

## 2018-06-08 DIAGNOSIS — B373 Candidiasis of vulva and vagina: Secondary | ICD-10-CM | POA: Diagnosis not present

## 2018-06-08 DIAGNOSIS — R69 Illness, unspecified: Secondary | ICD-10-CM | POA: Diagnosis not present

## 2018-06-08 DIAGNOSIS — R102 Pelvic and perineal pain: Secondary | ICD-10-CM | POA: Diagnosis present

## 2018-06-08 LAB — URINALYSIS, ROUTINE W REFLEX MICROSCOPIC
BILIRUBIN URINE: NEGATIVE
Glucose, UA: NEGATIVE mg/dL
Hgb urine dipstick: NEGATIVE
KETONES UR: 80 mg/dL — AB
Nitrite: NEGATIVE
Protein, ur: 30 mg/dL — AB
SPECIFIC GRAVITY, URINE: 1.032 — AB (ref 1.005–1.030)
pH: 5 (ref 5.0–8.0)

## 2018-06-08 LAB — WET PREP, GENITAL
Clue Cells Wet Prep HPF POC: NONE SEEN
Sperm: NONE SEEN
TRICH WET PREP: NONE SEEN
YEAST WET PREP: NONE SEEN

## 2018-06-08 LAB — POC URINE PREG, ED: PREG TEST UR: NEGATIVE

## 2018-06-08 MED ORDER — ACYCLOVIR 400 MG PO TABS: 400.0000 mg | ORAL_TABLET | Freq: Four times a day (QID) | ORAL | 0 refills | Status: AC

## 2018-06-08 MED ORDER — CEFTRIAXONE SODIUM 250 MG IJ SOLR
250.0000 mg | Freq: Once | INTRAMUSCULAR | Status: AC
  Administered 2018-06-08: 250 mg via INTRAMUSCULAR
  Filled 2018-06-08: qty 250

## 2018-06-08 MED ORDER — STERILE WATER FOR INJECTION IJ SOLN
INTRAMUSCULAR | Status: AC
  Administered 2018-06-08: 0.9 mL
  Filled 2018-06-08: qty 10

## 2018-06-08 MED ORDER — DOXYCYCLINE HYCLATE 100 MG PO CAPS: 100.0000 mg | ORAL_CAPSULE | Freq: Two times a day (BID) | ORAL | 0 refills | Status: AC

## 2018-06-08 MED ORDER — CEPHALEXIN 500 MG PO CAPS: 500.0000 mg | ORAL_CAPSULE | Freq: Three times a day (TID) | ORAL | 0 refills | Status: AC

## 2018-06-08 NOTE — ED Triage Notes (Signed)
Patient arrives with c/o dysuria that started Thursday. Patient reports chills last night. Patient states yesterday blisters formed on vagina. Patient reports recent unprotected sex. LMP September. Patient denies abdominal pain. Patient reports vaginal discharge, denies odor.

## 2018-06-08 NOTE — ED Notes (Signed)
Bed: ZO10 Expected date:  Expected time:  Means of arrival:  Comments: Monitor Broke!

## 2018-06-08 NOTE — ED Provider Notes (Signed)
[facility]-EMERGENCY DEPT Provider Note   CSN: [id number] Arrival date & time: 06/08/18  1420     History   Chief Complaint Chief Complaint  Patient presents with  . Dysuria  . Vaginal Pain    HPI Tamara Rodriguez is a 22 y.o. female history of depression who presents for evaluation of dysuria x2 days and vaginal irritation/blisters that she noticed today.  Patient states that over the last 3 days, she noticed some pain with urination.  She has not noticed any blood in her urine but states she felt like her urine was darker.  She has been eating and drinking appropriately denies any nausea/vomiting.  She has not had any fevers, abdominal pain, flank pain.  Patient also reports that today, she noticed some blisters to the vagina and states that they are painful.  She does report that she is currently sexually active with one partner.  He has a new partner and they do not use protection.  She reports that she was treated for chlamydia several years ago but no other STDs.  Patient reports her last menstrual cycle was in September and states that it is normal for her to be irregular.  The history is provided by the patient.    No past medical history on file.  Patient Active Problem List   Diagnosis Date Noted  . MDD (major depressive disorder), recurrent episode, moderate (HCC)   . Depression 03/18/2018  . Chlamydia 11/29/2015  . Irregular menses 11/26/2015  . Encounter for Depo-Provera contraception 11/26/2015  . Right ankle sprain 05/16/2011    No past surgical history on file.   OB History   None      Home Medications    Prior to Admission medications   Medication Sig Start Date End Date Taking? Authorizing Provider  acyclovir (ZOVIRAX) 400 MG tablet Take 1 tablet (400 mg total) by mouth 4 (four) times daily for 7 days. 06/08/18 06/15/18  Maxwell Caul, PA-C  cephALEXin (KEFLEX) 500 MG capsule Take 1 capsule (500 mg total) by mouth 3 (three)  times daily for 7 days. 06/08/18 06/15/18  Maxwell Caul, PA-C  doxycycline (VIBRAMYCIN) 100 MG capsule Take 1 capsule (100 mg total) by mouth 2 (two) times daily for 14 days. 06/08/18 06/22/18  Maxwell Caul, PA-C    Family History Family History  Problem Relation Age of Onset  . Diabetes Father   . Hypertension Father   . Asthma Mother   . Hypertension Maternal Aunt   . Cancer Maternal Aunt   . Hypertension Maternal Uncle   . Diabetes Paternal Aunt   . Diabetes Paternal Uncle   . Hypertension Maternal Grandmother   . Cancer Maternal Grandmother   . Hypertension Paternal Grandmother   . Heart attack Neg Hx   . Hyperlipidemia Neg Hx   . Sudden death Neg Hx     Social History Social History   Tobacco Use  . Smoking status: Never Smoker  . Smokeless tobacco: Never Used  Substance Use Topics  . Alcohol use: No    Alcohol/week: 0.0 standard drinks  . Drug use: No     Allergies   Latex   Review of Systems Review of Systems  Constitutional: Negative for fever.  Respiratory: Negative for shortness of breath.   Gastrointestinal: Negative for abdominal pain, nausea and vomiting.  Genitourinary: Positive for dysuria and vaginal pain. Negative for flank pain, hematuria, vaginal bleeding and vaginal discharge.  All other systems reviewed and are  negative.    Physical Exam Updated Vital Signs BP 105/69 (BP Location: Right Arm)   Pulse 84   Temp 98.1 F (36.7 C) (Oral)   Resp 17   Ht 5' (1.524 m)   Wt 47.6 kg   LMP 05/01/2018   SpO2 100%   BMI 20.51 kg/m   Physical Exam  Constitutional: She is oriented to person, place, and time. She appears well-developed and well-nourished.  HENT:  Head: Normocephalic and atraumatic.  Mouth/Throat: Oropharynx is clear and moist and mucous membranes are normal.  Eyes: Pupils are equal, round, and reactive to light. Conjunctivae, EOM and lids are normal.  Neck: Full passive range of motion without pain.    Cardiovascular: Normal rate, regular rhythm, normal heart sounds and normal pulses. Exam reveals no gallop and no friction rub.  No murmur heard. Pulmonary/Chest: Effort normal and breath sounds normal.  Abdominal: Soft. Normal appearance. There is no tenderness. There is no rigidity, no guarding and no CVA tenderness.  Abdomen is soft, non-distended, non-tender. No rigidity, No guarding. No peritoneal signs. No CVA tenderness.   Genitourinary: Uterus normal.    Cervix exhibits motion tenderness, discharge and friability. Right adnexum displays tenderness. Right adnexum displays no mass. Left adnexum displays tenderness. Left adnexum displays no mass.  Genitourinary Comments: The exam was performed with a chaperone present.  Erythematous vesicles noted to the right labia.  One appeared to be slightly draining.  No ulcers noted.  Cervical discharge noted with some surrounding erythema.  Patient also has some slight erythema noted to the insertion of the vaginal canal.  Patient with some CMT and adnexal tenderness bilaterally.  Question if the pain is from true CMT or from inflammation from herpes.  No mass.  Musculoskeletal: Normal range of motion.  Neurological: She is alert and oriented to person, place, and time.  Skin: Skin is warm and dry. Capillary refill takes less than 2 seconds.  Psychiatric: She has a normal mood and affect. Her speech is normal.  Nursing note and vitals reviewed.    ED Treatments / Results  Labs (all labs ordered are listed, but only abnormal results are displayed) Labs Reviewed  WET PREP, GENITAL - Abnormal; Notable for the following components:      Result Value   WBC, Wet Prep HPF POC MANY (*)    All other components within normal limits  URINALYSIS, ROUTINE W REFLEX MICROSCOPIC - Abnormal; Notable for the following components:   Color, Urine AMBER (*)    APPearance HAZY (*)    Specific Gravity, Urine 1.032 (*)    Ketones, ur 80 (*)    Protein, ur 30 (*)     Leukocytes, UA MODERATE (*)    Bacteria, UA RARE (*)    All other components within normal limits  HSV CULTURE AND TYPING  URINE CULTURE  POC URINE PREG, ED  GC/CHLAMYDIA PROBE AMP ([place]) NOT AT [facility]    EKG None  Radiology No results found.  Procedures Procedures (including critical care time)  Medications Ordered in ED Medications  cefTRIAXone (ROCEPHIN) injection 250 mg (250 mg Intramuscular Given 06/08/18 1622)  sterile water (preservative free) injection (0.9 mLs  Given 06/08/18 1622)     Initial Impression / Assessment and Plan / ED Course  I have reviewed the triage vital signs and the nursing notes.  Pertinent labs & imaging results that were available during my care of the patient were reviewed by me and considered in my medical decision making (see chart  for details).      22 y.o. F history of depression who presents for evaluation of dysuria, vaginal pain/blisters that have been ongoing for the last several days.  Fevers, abdominal pain, nausea/vomiting. Patient is afebrile, non-toxic appearing, sitting comfortably on examination table.  Vital signs reviewed and stable.  Abdomen and flank exam benign.  No CVA tenderness.  Consider herpes versus STDs versus UTI.  History/physical exam concerning for pyelonephritis, kidney stone.  We will plan to check urine, urine pregnancy.  Urine pregnancy negative.  UA shows moderate leukocytes, negative nitrites, pyuria.  There is bacteria but it is squamous epithelium so could be contaminant.  Will send urine culture.  Pelvic exam as documented above.  Patient had erythematous vesicles noted to the right labia that would concerning for herpes. Additionally, patient had discharge from the cervix as well as some erythema.  Patient had some mild CMT tenderness and bilateral adnexal tenderness.  No mass.  Concern for PID secondary to gonorrhea/chlamydia.  Do not suspect ovarian torsion, ovarian cyst as the cause of patient's  pain.  I discussed treatment options with patient.  Patient would go ahead and like to be treated for gonorrhea and chlamydia today.  Additionally, we will plan to treat for PID given exam.  Additionally, discussed with patient that her symptoms are concerning for herpes.  HSV culture sent but recommended treatment prior to cultures coming back. Patient is agreeable.  Wet prep shows no evidence of clue cells.  She is agreeable.  She has no known medical allergies.  Instructed patient on safe sex practices.  Patient instructed not to have intercourse with her partner until he is tested and treated. Patient had ample opportunity for questions and discussion. All patient's questions were answered with full understanding. Strict return precautions discussed. Patient expresses understanding and agreement to plan.   Final Clinical Impressions(s) / ED Diagnoses   Final diagnoses:  Genital herpes simplex, unspecified site  Pelvic inflammatory disease    ED Discharge Orders         Ordered    doxycycline (VIBRAMYCIN) 100 MG capsule  2 times daily     06/08/18 1637    acyclovir (ZOVIRAX) 400 MG tablet  4 times daily     06/08/18 1637    cephALEXin (KEFLEX) 500 MG capsule  3 times daily     06/08/18 1637           Maxwell Caul, PA-C 06/08/18 2248    Alvira Monday, MD 06/11/18 2212

## 2018-06-08 NOTE — Discharge Instructions (Addendum)
You have been treated today for an STD.   The test results with take 2-3 days to return. If there is an abnormal result, you will be notified. If you do not hear anything, that means the results were negative. You can also log on MyChart to see the results.   Take antibiotics as directed. Please take all of your antibiotics until finished.  Take Flagyl as directed.  It is very important that you do not consume any alcohol while taking this medication as it will cause you to become violently ill.  Additionally, your exam was also concerning for herpes.  Take medication as directed.  Your sexual partner needs to be treated too. Do not have sexual intercourse for the next 7 days and after your partner has been treated.   Follow-up with your primary care doctor in 2-4 days. If you do not have a primary care doctor, you can use one listed in the paperwork.   Return to the Emergency Department for any fever, abdominal pain, difficulty breathing, nausea/vomiting or any other worsening or concerning symptoms.

## 2018-06-10 LAB — GC/CHLAMYDIA PROBE AMP (~~LOC~~) NOT AT ARMC
CHLAMYDIA, DNA PROBE: NEGATIVE
NEISSERIA GONORRHEA: NEGATIVE

## 2018-06-12 LAB — HSV CULTURE AND TYPING

## 2018-07-05 ENCOUNTER — Ambulatory Visit: Payer: 59 | Admitting: Family

## 2018-07-10 ENCOUNTER — Encounter: Payer: Self-pay | Admitting: Family

## 2018-07-10 ENCOUNTER — Ambulatory Visit (INDEPENDENT_AMBULATORY_CARE_PROVIDER_SITE_OTHER): Payer: 59 | Admitting: Family

## 2018-07-10 ENCOUNTER — Other Ambulatory Visit (HOSPITAL_COMMUNITY)
Admission: RE | Admit: 2018-07-10 | Discharge: 2018-07-10 | Disposition: A | Discharge status: Home / Self Care | Payer: 59 | Source: Ambulatory Visit | Attending: Family | Admitting: Family

## 2018-07-10 VITALS — BP 89/59 | HR 64 | Ht 61.0 in | Wt 101.4 lb

## 2018-07-10 DIAGNOSIS — R69 Illness, unspecified: Secondary | ICD-10-CM | POA: Diagnosis not present

## 2018-07-10 DIAGNOSIS — Z3042 Encounter for surveillance of injectable contraceptive: Secondary | ICD-10-CM

## 2018-07-10 DIAGNOSIS — Z113 Encounter for screening for infections with a predominantly sexual mode of transmission: Secondary | ICD-10-CM | POA: Diagnosis not present

## 2018-07-10 DIAGNOSIS — Z124 Encounter for screening for malignant neoplasm of cervix: Secondary | ICD-10-CM | POA: Insufficient documentation

## 2018-07-10 DIAGNOSIS — Z3202 Encounter for pregnancy test, result negative: Secondary | ICD-10-CM

## 2018-07-10 MED ORDER — MEDROXYPROGESTERONE ACETATE 150 MG/ML IM SUSP
150.0000 mg | Freq: Once | INTRAMUSCULAR | Status: AC
  Administered 2018-07-10: 150 mg via INTRAMUSCULAR

## 2018-07-10 NOTE — Progress Notes (Signed)
History was provided by the patient.  Tamara Rodriguez is a 22 y.o. female who is here for STI testing and Depo   PCP confirmed? Yes.    Patient, No Pcp Per  HPI:   -wants to remain on Depo  -needs PAP today  -electing STI screening, is not symptomatic  -no breakthrough bleeding on Depo  -wants HSV blood test to confirm her diagnosis   Review of Systems  Constitutional: Negative for malaise/fatigue.  Eyes: Negative for double vision.  Respiratory: Negative for shortness of breath.   Cardiovascular: Negative for chest pain and palpitations.  Gastrointestinal: Negative for abdominal pain, constipation, diarrhea, nausea and vomiting.  Genitourinary: Negative for dysuria.  Musculoskeletal: Negative for joint pain and myalgias.  Skin: Negative for rash.  Neurological: Negative for dizziness and headaches.  Endo/Heme/Allergies: Does not bruise/bleed easily.     Patient Active Problem List   Diagnosis Date Noted  . MDD (major depressive disorder), recurrent episode, moderate (HCC)   . Depression 03/18/2018  . Chlamydia 11/29/2015  . Irregular menses 11/26/2015  . Encounter for Depo-Provera contraception 11/26/2015  . Right ankle sprain 05/16/2011    No current outpatient medications on file prior to visit.   No current facility-administered medications on file prior to visit.     Allergies  Allergen Reactions  . Latex Hives    Physical Exam:    Vitals:   07/10/18 1038  BP: (!) 89/59  Pulse: 64  Weight: 101 lb 6.4 oz (46 kg)  Height: 5\' 1"  (1.549 m)   Wt Readings from Last 3 Encounters:  07/10/18 101 lb 6.4 oz (46 kg)  06/08/18 105 lb (47.6 kg)  05/10/18 107 lb (48.5 kg)     Growth percentile SmartLinks can only be used for patients less than 40 years old. No LMP recorded. Patient has had an injection.  Physical Exam Vitals signs and nursing note reviewed. Exam conducted with a chaperone present.  Constitutional:      General: She is not in acute  distress.    Appearance: She is well-developed.  Neck:     Thyroid: No thyromegaly.  Cardiovascular:     Rate and Rhythm: Normal rate and regular rhythm.     Heart sounds: No murmur.  Pulmonary:     Breath sounds: Normal breath sounds.  Abdominal:     Palpations: Abdomen is soft. There is no mass.     Tenderness: There is no abdominal tenderness. There is no guarding.  Genitourinary:    General: Normal vulva.     Vagina: No vaginal discharge.     Rectum: Normal.     Comments: No CMT, No adnexal tenderness  Cervix non-friable  Lymphadenopathy:     Cervical: No cervical adenopathy.  Skin:    General: Skin is warm.     Findings: No rash.  Neurological:     Mental Status: She is alert.     Assessment/Plan: 1. Encounter for Depo-Provera contraception -injection today  -continue with Depo calendar -condom use reviewed  2. Pap smear for cervical cancer screening -pap due, will call with results - Cytology - PAP  3. Routine screening for STI (sexually transmitted infection) -STI screenings per her request -reviewed that we usually do not follow up with blood testing for confirmatory, however will order and discuss results when received  -reviewed differences in HSV 1 and HSV 2  - RPR - HIV Antibody (routine testing w rflx) - HSV(herpes simplex vrs) 1+2 ab-IgG - HSV 1/2 Ab (IgM), IFA  w/rflx Titer  4. Pregnancy examination or test, negative result Negative  - POCT urine pregnancy

## 2018-07-11 LAB — HIV ANTIBODY (ROUTINE TESTING W REFLEX): HIV 1&2 Ab, 4th Generation: NONREACTIVE

## 2018-07-14 LAB — HSV 1/2 AB (IGM), IFA W/RFLX TITER
HSV 1 IGM SCREEN: NEGATIVE
HSV 2 IgM Screen: NEGATIVE

## 2018-07-14 LAB — HSV(HERPES SIMPLEX VRS) I + II AB-IGG
HAV 1 IGG,TYPE SPECIFIC AB: 15.2 index — ABNORMAL HIGH
HSV 2 IGG,TYPE SPECIFIC AB: <0.9 index

## 2018-07-14 LAB — RPR: RPR: NONREACTIVE

## 2018-07-15 LAB — CYTOLOGY - PAP
Bacterial vaginitis: POSITIVE — AB
CANDIDA VAGINITIS: NEGATIVE
CHLAMYDIA, DNA PROBE: NEGATIVE
Neisseria Gonorrhea: NEGATIVE
Trichomonas: NEGATIVE

## 2018-07-16 ENCOUNTER — Other Ambulatory Visit: Payer: Self-pay | Admitting: Pediatrics

## 2018-07-16 ENCOUNTER — Other Ambulatory Visit: Payer: Self-pay

## 2018-07-16 MED ORDER — METRONIDAZOLE 500 MG PO TABS: 500.0000 mg | ORAL_TABLET | Freq: Two times a day (BID) | ORAL | 0 refills | Status: AC

## 2018-07-16 MED ORDER — METRONIDAZOLE 500 MG PO TABS: 500.0000 mg | ORAL_TABLET | Freq: Two times a day (BID) | ORAL | 0 refills | Status: DC

## 2018-08-11 LAB — POCT URINE PREGNANCY: Preg Test, Ur: NEGATIVE

## 2018-09-09 ENCOUNTER — Ambulatory Visit: Payer: 59 | Admitting: Pediatrics

## 2018-11-13 ENCOUNTER — Ambulatory Visit (INDEPENDENT_AMBULATORY_CARE_PROVIDER_SITE_OTHER): Payer: 59 | Admitting: Family

## 2018-11-13 ENCOUNTER — Other Ambulatory Visit: Payer: Self-pay

## 2018-11-13 ENCOUNTER — Encounter: Payer: Self-pay | Admitting: Family

## 2018-11-13 VITALS — BP 91/60 | HR 94 | Ht 60.63 in | Wt 127.2 lb

## 2018-11-13 DIAGNOSIS — Z113 Encounter for screening for infections with a predominantly sexual mode of transmission: Secondary | ICD-10-CM | POA: Diagnosis not present

## 2018-11-13 DIAGNOSIS — N898 Other specified noninflammatory disorders of vagina: Secondary | ICD-10-CM | POA: Diagnosis not present

## 2018-11-13 DIAGNOSIS — Z3202 Encounter for pregnancy test, result negative: Secondary | ICD-10-CM

## 2018-11-13 DIAGNOSIS — Z309 Encounter for contraceptive management, unspecified: Secondary | ICD-10-CM

## 2018-11-13 DIAGNOSIS — R69 Illness, unspecified: Secondary | ICD-10-CM | POA: Diagnosis not present

## 2018-11-13 MED ORDER — MEDROXYPROGESTERONE ACETATE 150 MG/ML IM SUSP
150.0000 mg | Freq: Once | INTRAMUSCULAR | Status: AC
  Administered 2018-11-13: 150 mg via INTRAMUSCULAR

## 2018-11-13 MED ORDER — MUPIROCIN 2 % EX OINT: 1.0000 "application " | TOPICAL_OINTMENT | Freq: Two times a day (BID) | CUTANEOUS | 0 refills | Status: AC

## 2018-11-13 NOTE — Patient Instructions (Signed)
Your vaginal irritation may be secondary to folliculitis which can occur after shaving. Please use mupirocin twice daily for next week. If worsening irritation or becomes more painful, please return to clinic. If your wet prep is positive, we will call you with results.   You received you Depo shot today. You will need to return in 3 months for the next shot.   Vaginal Hygiene Healthy practices for vaginal hygiene - Avoid pajamas. A robe / nightgown allows better air circulation. Sleep without panties / panties whenever possible. - Wear cotton panties / panties during the day. After washing the panties / panties, rinse them twice to avoid irritating residues. Do not use fabric softeners for panties and / or swimsuits. - Avoid tights, leotard, leggings, tight pants or other tight attire. Loose skirts and pants allow air to circulate. - Avoid the pantiprotector. Better use tampons or sanitary pads. It is beneficial to bathe with warm water every day: - Soak in clean water (without soap) for 10 to 15 minutes. It has not been specifically studied that adding vinegar or baking soda / baking soda to water is of benefit and may not be better than water alone. - Use soap to wash the other regions apart from the genital area before leaving the tub. Limit the use of soap in the genital areas. Use soaps without fragrance. - Rinse the genital area and pat dry. A hair dryer can only be used if cold air is used. - Do not use bubble bath or fragrant soap. - Do not use vaginal sprays or talcum powder. These contain chemicals that irritate the skin. - If the genital area is sore or swollen, fragrance-free disposable wipes can be used instead of toilet paper. - Emollients, such as Vaseline may help protect the skin and may be applied to the irritated area. - Always remember to clean yourself from front to back after bowel movements. Pat dry after urinating. - Don't keep the wet swimsuit on for a long time after  swimming.

## 2018-11-13 NOTE — Progress Notes (Signed)
THIS RECORD MAY CONTAIN CONFIDENTIAL INFORMATION THAT SHOULD NOT BE RELEASED WITHOUT REVIEW OF THE SERVICE PROVIDER.  Adolescent Medicine Consultation Follow-Up Visit Tamara Rodriguez  is a 23 y.o. female referred by No ref. provider found here today for follow-up regarding contraception.    Plan at last adolescent specialty clinic  visit included continue depo.  Pertinent Labs? No Growth Chart Viewed? no   History was provided by the patient.  Interpreter? no  Chief Complaint  Patient presents with  . Follow-up    HPI:   PCP Confirmed?  yes  Irritated, swelling of vaginal area starting a few days ago Has noticed bumps after shaving No pain No dysuria, malodorous discharge  Would like to continue with depo    No LMP recorded. Patient has had an injection. Allergies  Allergen Reactions  . Latex Hives   No current outpatient medications on file prior to visit.   No current facility-administered medications on file prior to visit.     Patient Active Problem List   Diagnosis Date Noted  . MDD (major depressive disorder), recurrent episode, moderate (HCC)   . Depression 03/18/2018  . Chlamydia 11/29/2015  . Irregular menses 11/26/2015  . Encounter for Depo-Provera contraception 11/26/2015  . Right ankle sprain 05/16/2011    Physical Exam:  Vitals:   11/13/18 1400  BP: 91/60  Pulse: 94  Weight: 127 lb 3.2 oz (57.7 kg)  Height: 5' 0.63" (1.54 m)   BP 91/60   Pulse 94   Ht 5' 0.63" (1.54 m)   Wt 127 lb 3.2 oz (57.7 kg)   BMI 24.33 kg/m  Body mass index: body mass index is 24.33 kg/m. Growth percentile SmartLinks can only be used for patients less than 49 years old.  Physical Exam Constitutional:      General: She is not in acute distress.    Appearance: Normal appearance.  HENT:     Head: Normocephalic and atraumatic.     Nose: Nose normal.     Mouth/Throat:     Mouth: Mucous membranes are moist.  Eyes:     Extraocular Movements: Extraocular  movements intact.     Pupils: Pupils are equal, round, and reactive to light.  Neck:     Musculoskeletal: Normal range of motion and neck supple.  Cardiovascular:     Rate and Rhythm: Normal rate and regular rhythm.     Heart sounds: No murmur.  Pulmonary:     Effort: Pulmonary effort is normal. No respiratory distress.     Breath sounds: Normal breath sounds.  Genitourinary:    Vagina: No vaginal discharge.     Comments: Labia with several pustules, no ulcerative lesions Skin:    General: Skin is warm and dry.  Neurological:     General: No focal deficit present.     Mental Status: She is alert.     Assessment/Plan: Aayah is a 23 year old female that presented for contraception and vaginal irritation w/ bumps  1. Encounter for contraceptive management, unspecified type Administered depo - medroxyPROGESTERone (DEPO-PROVERA) injection 150 mg  2. Vaginal irritation Prescribed mupirocin for folliculitis, provided handout on vaginal hygiene Follow-up wet prep  - mupirocin ointment (BACTROBAN) 2 %; Apply 1 application topically 2 (two) times daily for 7 days.  Dispense: 22 g; Refill: 0 - WET PREP BY MOLECULAR PROBE  3. Routine screening for STI (sexually transmitted infection) - C. trachomatis/N. gonorrhoeae RNA  4. Pregnancy examination or test, negative result Urine preg negative  - POCT  urine pregnancy     Follow-up:  Return in about 3 months (around 02/13/2019) for Depo shot NURSE VISIT .   Medical decision-making:  >40 minutes spent face to face with patient with more than 50% of appointment spent discussing diagnosis, management, follow-up, and reviewing of vaginal irritation and depo.

## 2018-11-14 LAB — C. TRACHOMATIS/N. GONORRHOEAE RNA
C. trachomatis RNA, TMA: NOT DETECTED
N. GONORRHOEAE RNA, TMA: NOT DETECTED

## 2018-11-14 LAB — WET PREP BY MOLECULAR PROBE
Candida species: DETECTED — AB
Gardnerella vaginalis: NOT DETECTED
MICRO NUMBER: 332860
SPECIMEN QUALITY:: ADEQUATE
Trichomonas vaginosis: NOT DETECTED

## 2018-11-15 ENCOUNTER — Telehealth: Payer: Self-pay

## 2018-11-15 ENCOUNTER — Other Ambulatory Visit: Payer: Self-pay | Admitting: Family

## 2018-11-15 MED ORDER — ACYCLOVIR 400 MG PO TABS: 400.0000 mg | ORAL_TABLET | Freq: Three times a day (TID) | ORAL | 0 refills | Status: DC

## 2018-11-15 NOTE — Telephone Encounter (Signed)
[name] reports that mupirocin is not helping her. She now has clumpy, whitish-green discharge, and swelling. Skin is irritated, red and hot. Bernell List NP spoke with patient and plan is to prescribed acyclovir which was sent to the pharmacy.

## 2018-11-18 MED ORDER — ACYCLOVIR 400 MG PO TABS: 400.0000 mg | ORAL_TABLET | Freq: Three times a day (TID) | ORAL | 0 refills | Status: AC

## 2018-11-19 LAB — POCT URINE PREGNANCY: PREG TEST UR: NEGATIVE

## 2018-12-31 NOTE — Progress Notes (Signed)
Attending Co-Signature.  I am the supervising provider and available for consultation as needed for the the nurse practitioner who assisted the resident with the assessment and management plan as documented.     Mirage Pfefferkorn F Kiondra Caicedo, MD Adolescent Medicine Specialist   

## 2019-01-27 ENCOUNTER — Telehealth: Payer: Self-pay

## 2019-01-27 NOTE — Telephone Encounter (Signed)
Etola left message on nurse line requesting call back from Beatriz Stallion NP; no other information provided.

## 2019-01-27 NOTE — Telephone Encounter (Signed)
Called and spoke with patient. She would like to have testing done for STI's and herpes. Pt asymptomatic and no lesions present. Explained that HSV testing is likely to test wnl but suggested patient be seen in the office if she develops sx. She has an upcoming appointment set for Friday and asks for urine gc/chl and wet prep to be completed at nurse visit. She asks is partner can be tested as well. Suggested partner make appointment with [facility] next week once registration is completed or sooner appointment if patient is symptomatic.

## 2019-01-30 ENCOUNTER — Telehealth: Payer: Self-pay

## 2019-01-30 NOTE — Telephone Encounter (Signed)
Pre-screening for in-office visit  1. Who is bringing the patient to the visit?  PT IS BRINGING SELF.  Informed only one adult can bring patient to the visit to limit possible exposure to COVID19. And if they have a face mask to wear it.   2. Has the person bringing the patient or the patient traveled outside of the state in the past 14 days? NO  3. Has the person bringing the patient or the patient had contact with anyone with suspected or confirmed COVID-19 in the last 14 days? NO   4. Has the person bringing the patient or the patient had any of these symptoms in the last 14 days? NO   Fever (temp 100.4 F or higher) Difficulty breathing Cough  If all answers are negative, advise patient to call our office prior to your appointment if you or the patient develop any of the symptoms listed above.  PT WAS ADIVISED If any answers are yes, cancel in-office visit and schedule the patient for a same day telehealth visit with a provider to discuss the next steps.

## 2019-01-31 ENCOUNTER — Ambulatory Visit (INDEPENDENT_AMBULATORY_CARE_PROVIDER_SITE_OTHER): Payer: 59 | Admitting: Family

## 2019-01-31 ENCOUNTER — Other Ambulatory Visit: Payer: Self-pay

## 2019-01-31 ENCOUNTER — Ambulatory Visit: Payer: 59

## 2019-01-31 DIAGNOSIS — R69 Illness, unspecified: Secondary | ICD-10-CM | POA: Diagnosis not present

## 2019-01-31 DIAGNOSIS — Z3042 Encounter for surveillance of injectable contraceptive: Secondary | ICD-10-CM

## 2019-01-31 DIAGNOSIS — Z113 Encounter for screening for infections with a predominantly sexual mode of transmission: Secondary | ICD-10-CM

## 2019-01-31 MED ORDER — MEDROXYPROGESTERONE ACETATE 150 MG/ML IM SUSP
150.0000 mg | Freq: Once | INTRAMUSCULAR | Status: AC
  Administered 2019-01-31: 150 mg via INTRAMUSCULAR

## 2019-01-31 NOTE — Progress Notes (Signed)
Pt presents for depo injection. Pt within depo window, no urine hcg needed. Injection given, tolerated well. F/u depo injection visit scheduled. Testing for Gc/Chl as well considering recent positive

## 2019-02-01 LAB — WET PREP BY MOLECULAR PROBE
Candida species: NOT DETECTED
Gardnerella vaginalis: NOT DETECTED
MICRO NUMBER:: 541617
SPECIMEN QUALITY:: ADEQUATE
Trichomonas vaginosis: NOT DETECTED

## 2019-02-03 LAB — C. TRACHOMATIS/N. GONORRHOEAE RNA
C. trachomatis RNA, TMA: NOT DETECTED
N. gonorrhoeae RNA, TMA: NOT DETECTED

## 2019-02-04 ENCOUNTER — Ambulatory Visit: Payer: 59 | Admitting: Family

## 2019-04-18 ENCOUNTER — Ambulatory Visit: Payer: Self-pay

## 2019-06-24 ENCOUNTER — Encounter: Payer: Self-pay | Admitting: Family

## 2019-06-24 ENCOUNTER — Ambulatory Visit (INDEPENDENT_AMBULATORY_CARE_PROVIDER_SITE_OTHER): Payer: PRIVATE HEALTH INSURANCE

## 2019-06-24 ENCOUNTER — Other Ambulatory Visit: Payer: Self-pay

## 2019-06-24 ENCOUNTER — Ambulatory Visit (INDEPENDENT_AMBULATORY_CARE_PROVIDER_SITE_OTHER): Payer: PRIVATE HEALTH INSURANCE | Admitting: Family

## 2019-06-24 ENCOUNTER — Other Ambulatory Visit (HOSPITAL_COMMUNITY)
Admission: RE | Admit: 2019-06-24 | Discharge: 2019-06-24 | Disposition: A | Discharge status: Home / Self Care | Payer: PRIVATE HEALTH INSURANCE | Source: Ambulatory Visit | Attending: Family | Admitting: Family

## 2019-06-24 VITALS — Temp 97.1°F

## 2019-06-24 DIAGNOSIS — R8290 Unspecified abnormal findings in urine: Secondary | ICD-10-CM

## 2019-06-24 DIAGNOSIS — N898 Other specified noninflammatory disorders of vagina: Secondary | ICD-10-CM | POA: Diagnosis not present

## 2019-06-24 DIAGNOSIS — Z113 Encounter for screening for infections with a predominantly sexual mode of transmission: Secondary | ICD-10-CM | POA: Insufficient documentation

## 2019-06-24 DIAGNOSIS — R300 Dysuria: Secondary | ICD-10-CM | POA: Diagnosis not present

## 2019-06-24 LAB — POCT URINALYSIS DIPSTICK
Bilirubin, UA: NEGATIVE
Glucose, UA: NEGATIVE
Ketones, UA: NEGATIVE
Nitrite, UA: NEGATIVE
Protein, UA: POSITIVE — AB
Spec Grav, UA: 1.01 (ref 1.010–1.025)
Urobilinogen, UA: 1 E.U./dL
pH, UA: >=9 — AB (ref 5.0–8.0)

## 2019-06-24 NOTE — Progress Notes (Addendum)
Pt came in today due to changes in vaginal discharge (brown in color) and swelling in vaginal area. No fever reported. With assistance from [place] was able to detect noticeable swelling around her labial area and small, irritated area around top of vaginal canal. Vaginal discharge present and swabbed affected area for HSV. Denies vaginal itching. Obtained wet prep and urine to r/o infection. No pain or discomfort during sex. Last sexual encounter was one week ago and used protection. She did note some dysuria about 6 days ago and took AZO pills and symptoms resolved. Due to abnormal UA, will also send for culture as well. Will keep appointment this afternoon with provider to discuss ongoing symptoms. Pt goes out of town tomorrow.

## 2019-06-24 NOTE — Addendum Note (Signed)
Addended by: Jason Fila on: 06/24/2019 12:02 PM   Modules accepted: Orders

## 2019-06-24 NOTE — Progress Notes (Addendum)
THIS RECORD MAY CONTAIN CONFIDENTIAL INFORMATION THAT SHOULD NOT BE RELEASED WITHOUT REVIEW OF THE SERVICE PROVIDER.  Virtual Visit via Video Note  I connected with Tamara Rodriguez on 06/24/19 at  2:00 PM EDT by a video enabled telemedicine application and verified that I am speaking with the correct person using two identifiers.  I discussed the limitations of evaluation and management by telemedicine and the availability of in person appointments. The patient expressed understanding and agreed to proceed.  Patient's personal or confidential phone number: [phone]  History was provided by the patient.  History of Present Illness: Patient presented to clinic this AM for evaluation of "changes in vaginal discharge (brown in color) and swelling in vaginal area." Vaginal exam was notable for vaginal discharge and irritation at top of vaginal canal. HSV, UA, urine culture, urine cytology, and wet prep was obtained (result pending). UA was notable for protein and large leukocytes.   Last weeks patient was concerned that she had a UTI due to dysuria and vaginal irritation. She took OTC AZO cranberry pills with resolution of symptoms.   Abnormal vaginal discharge, described as thick and dark brown in color, started yesterday and requiring the patient to use panty liners that she changes ~ twice daily. No associated abdominal pain, fever, dysuria, pruritis, nausea, vomiting, or diarrhea. Her LMP started ~10/21 lasting 4 days before stopping on 10/25.   Since yesterday she has also reported worsening vaginal irritation. She denies pain and states that it is not bothersome. She denies evidence of rash of lesions. She reports some redness and mild swelling at superior portion.   Last sexual encounter was last week with a new partner. Protection was used and sex was not painful.  Last depo shot was 01/31/2019. Patient no longer wants to get Depo because of fear that it will cause complications in the  long-term. Her mother also reportedly did not like that she was not having regular periods. Patient reportedly cannot keep up with taking pills at the same time and is afraid of IUD and nexplanon.    Observations/Objective: Well-appearing and well-nourished female in no apparent distress  Appropriate mood, affect and speech  Comfortable WOB; audible congestion with frequent throat clearing  Exam limited as patient is virtual and in her car  Assessment and Plan: Tamara Rodriguez is a 23 y.o. F with a hx of irregular menses (previously on depo) who presents with 1-2 days of abnormal vaginal discharge and vaginal irritation.   Patient is well-appearing though in office exam this am was notable for vaginal discharge and irritation at superior portion of vaginal canal. The appropriate screening tests were obtained and the team will follow up the results. UA showed leukocytes and protein, culture is pending. Vaginal discharge likely old blood from recent period, though cannot rule out infection at this time. Vaginal irration concerning for possible infection such as UTI vs yeast causing vaginitis vs a lesion from HSV vs bacterial vaginosis.   Return precautions discussed. Patient instructed to call if symptoms worsen or she begins to develop additional symptoms such as fever, abdominal pain, dysuria/hematuria/polyuria, or decreased activity. Discussed safe sex practices and birth control options. Patient expressed understanding.  Follow Up Instructions: I discussed the assessment and treatment plan with the patient. The patient was provided an opportunity to ask questions and all were answered. The patient agreed with the plan and demonstrated an understanding of the instructions.   The patient was advised to call back or seek an in-person evaluation  if the symptoms worsen or if the condition fails to improve as anticipated.  Team will call patient with labs results and further treatment  recommendations.  Medical decision-making:  I provided 15 minutes of non-face-to-face with patient with more than 50% of appointment spent discussing diagnosis, management, and follow-up.  Isabel Ardila, DO

## 2019-06-24 NOTE — Addendum Note (Signed)
Addended by: Jason Fila on: 06/24/2019 12:10 PM   Modules accepted: Orders

## 2019-06-25 ENCOUNTER — Other Ambulatory Visit: Payer: Self-pay | Admitting: Family

## 2019-06-25 LAB — WET PREP BY MOLECULAR PROBE
Candida species: DETECTED — AB
MICRO NUMBER:: 1034739
SPECIMEN QUALITY:: ADEQUATE
Trichomonas vaginosis: NOT DETECTED

## 2019-06-25 MED ORDER — METRONIDAZOLE 500 MG PO TABS: 500.0000 mg | ORAL_TABLET | Freq: Two times a day (BID) | ORAL | 0 refills | Status: DC

## 2019-06-25 MED ORDER — FLUCONAZOLE 150 MG PO TABS: 150.0000 mg | ORAL_TABLET | Freq: Every day | ORAL | 0 refills | Status: DC

## 2019-06-26 ENCOUNTER — Encounter: Payer: Self-pay | Admitting: Family

## 2019-06-26 LAB — HERPES SIMPLEX VIRUS(HSV) DNA BY PCR
HSV 1 DNA: NOT DETECTED
HSV 2 DNA: NOT DETECTED

## 2019-06-26 LAB — URINE CULTURE
MICRO NUMBER:: 1034740
SPECIMEN QUALITY:: ADEQUATE

## 2019-06-26 NOTE — Progress Notes (Signed)
Supervising Provider Co-Signature  I reviewed with the resident the medical history and the resident's findings.  I discussed with the resident the patient's diagnosis and concur with the treatment plan as documented in the resident's note.  Chabely Norby M Dream Nodal, NP  

## 2019-06-26 NOTE — Progress Notes (Signed)
Supervising Provider Co-Signature  I reviewed with the resident the medical history and the resident's findings.  I discussed with the resident the patient's diagnosis and concur with the treatment plan as documented in the resident's note.  Ivin Rosenbloom M Taria Castrillo, NP  

## 2019-06-27 LAB — URINE CYTOLOGY ANCILLARY ONLY
Chlamydia: NEGATIVE
Comment: NEGATIVE
Comment: NEGATIVE
Comment: NORMAL
Neisseria Gonorrhea: NEGATIVE
Trichomonas: NEGATIVE

## 2019-09-08 ENCOUNTER — Telehealth: Payer: Self-pay

## 2019-09-08 ENCOUNTER — Inpatient Hospital Stay
Admission: RE | Admit: 2019-09-08 | Discharge: 2019-09-08 | Disposition: A | Discharge status: Home / Self Care | Payer: PRIVATE HEALTH INSURANCE | Source: Ambulatory Visit

## 2019-09-08 ENCOUNTER — Telehealth: Payer: PRIVATE HEALTH INSURANCE | Admitting: Physician Assistant

## 2019-09-08 DIAGNOSIS — R102 Pelvic and perineal pain: Secondary | ICD-10-CM

## 2019-09-08 NOTE — Progress Notes (Signed)
Hi Tamara Rodriguez,   I am sorry you are not feeling well.  I am uncomfortable treating your symptoms via e-visit, as what you are describing could be a number of things. I would hate to misdiagnose and treat you incorrectly.  Based on what you shared with me, I feel your condition warrants further evaluation and I recommend that you be seen for a face to face office visit.   NOTE: If you entered your credit card information for this eVisit, you will not be charged. You may see a "hold" on your card for the $35 but that hold will drop off and you will not have a charge processed.   If you are having a true medical emergency please call 911.      For an urgent face to face visit, [place] has five urgent care centers for your convenience:      NEW:  [facility] at [facility] Directions [phone] [address] [place], [place] [address] . 10 am - 6pm Monday - Friday    [facility]) Get Driving Directions [phone] [address] [place], [place] [address] . 10 am to 8 pm Monday-Friday . 12 pm to 8 pm [facility] Urgent Care at [facility] Get Driving Directions [phone] [address] [place], [place] [address] . 8 am to 8 pm Monday-Friday . 9 am to 6 pm Saturday . 11 am to 6 pm Sunday     [facility] at [facility] Get Driving Directions  [phone] [address] [place], [place] [address] . 8 am to 8 pm Monday-Friday . 8 am to 4 pm [facility] Urgent Care at [facility] Directions [phone] [address] [place], [place] [address] . 12 pm to 6 pm Monday-Friday      Your e-visit answers were reviewed by a board certified advanced clinical practitioner to complete your personal care plan.  Thank you for using e-Visits.

## 2019-09-08 NOTE — Telephone Encounter (Signed)
Has a question about an RX

## 2019-09-08 NOTE — Telephone Encounter (Signed)
Note not complete. Routed to provider.

## 2019-11-29 ENCOUNTER — Ambulatory Visit: Payer: PRIVATE HEALTH INSURANCE | Attending: Internal Medicine

## 2019-11-29 DIAGNOSIS — Z23 Encounter for immunization: Secondary | ICD-10-CM

## 2019-11-29 NOTE — Progress Notes (Signed)
   Covid-19 Vaccination Clinic  Name:  Tamara Rodriguez    MRN: [id number] DOB: Aug 30, 1995  11/29/2019  Tamara Rodriguez was observed post Covid-19 immunization for 15 minutes without incident. She was provided with Vaccine Information Sheet and instruction to access the V-Safe system.   Tamara Rodriguez was instructed to call 911 with any severe reactions post vaccine: Marland Kitchen Difficulty breathing  . Swelling of face and throat  . A fast heartbeat  . A bad rash all over body  . Dizziness and weakness   Immunizations Administered    Name Date Dose VIS Date Route   Pfizer COVID-19 Vaccine 11/29/2019  4:25 PM 0.3 mL 08/08/2019 Intramuscular   Manufacturer: [employer], [employer]   Lot: [id number]   NDC: [address]-6

## 2019-12-24 ENCOUNTER — Ambulatory Visit: Payer: PRIVATE HEALTH INSURANCE | Attending: Internal Medicine

## 2019-12-24 DIAGNOSIS — Z23 Encounter for immunization: Secondary | ICD-10-CM

## 2019-12-24 NOTE — Progress Notes (Signed)
   Covid-19 Vaccination Clinic  Name:  Tamara Rodriguez    MRN: [id number] DOB: [birth year]  12/24/2019  Ms. Calder was observed post Covid-19 immunization for 15 minutes without incident. She was provided with Vaccine Information Sheet and instruction to access the V-Safe system.   Ms. Klomp was instructed to call 911 with any severe reactions post vaccine: Marland Kitchen Difficulty breathing  . Swelling of face and throat  . A fast heartbeat  . A bad rash all over body  . Dizziness and weakness   Immunizations Administered    Name Date Dose VIS Date Route   Pfizer COVID-19 Vaccine 12/24/2019  9:01 AM 0.3 mL 10/22/2018 Intramuscular   Manufacturer: [employer], [employer]   Lot: [id number]   NDC: [address]-6

## 2020-02-05 ENCOUNTER — Ambulatory Visit (HOSPITAL_COMMUNITY)
Admission: EM | Admit: 2020-02-05 | Discharge: 2020-02-05 | Disposition: A | Discharge status: Home / Self Care | Payer: 59 | Attending: Physician Assistant | Admitting: Physician Assistant

## 2020-02-05 ENCOUNTER — Other Ambulatory Visit: Payer: Self-pay

## 2020-02-05 ENCOUNTER — Encounter (HOSPITAL_COMMUNITY): Payer: Self-pay

## 2020-02-05 DIAGNOSIS — S29011A Strain of muscle and tendon of front wall of thorax, initial encounter: Secondary | ICD-10-CM

## 2020-02-05 DIAGNOSIS — M549 Dorsalgia, unspecified: Secondary | ICD-10-CM

## 2020-02-05 MED ORDER — IBUPROFEN 600 MG PO TABS: 600.0000 mg | ORAL_TABLET | Freq: Four times a day (QID) | ORAL | 0 refills | Status: DC | PRN

## 2020-02-05 MED ORDER — TIZANIDINE HCL 4 MG PO TABS: 4.0000 mg | ORAL_TABLET | Freq: Every day | ORAL | 0 refills | Status: AC

## 2020-02-05 NOTE — ED Triage Notes (Signed)
Pt is here with on & off back pain that appears when turning a  Certain way this started this morning, pt currently works at [employer] and states it could be job related. Pt has not taken any meds to relieve discomfort.

## 2020-02-05 NOTE — Discharge Instructions (Addendum)
I think you strained the muscles in your upper back and chest.  Want you to take ibuprofen every 6 hours as needed for the pain Take the Zanaflex at night, this will make you sleepy so do not drive, operate machinery or drink alcohol within 8 hours of taking this.  Pain becomes more severe in your chest area  or you have development of shortness of breath please return or follow-up in the  emergency department  If pain becomes persistent and is just not improving please follow-up with sports medicine  Establish primary care with the family medicine.

## 2020-02-05 NOTE — ED Provider Notes (Signed)
[facility]    CSN: [id number] Arrival date & time: 02/05/20  1326      History   Chief Complaint Chief Complaint  Patient presents with  . Back Pain    HPI Tamara Rodriguez is a 24 y.o. female.   Patient reports urgent care for evaluation of upper back and upper chest pain.  Pain is on left side.  She reports this is worse with certain movements such as twisting and pushing movements.  She notices it more when pushing and moving boxes at work.  She works for [employer].  Pain with sitting enough today that she had to leave work.  She points to her upper left chest and upper left back when describing the pain.  She motions twisting to the right and left and arm across her chest for pain.  Denies radiation down the arm.  Denies numbness or tingling.  Denies shortness of breath.  Pain in the upper back occasionally occurs with deep breath however otherwise has not been an issue with breathing.  She reports a similar it to soda few months ago that she put some Biofreeze on and went away after a few days.  She believes strongly this is related to work.  No history of early cardiac death in the family.     History reviewed. No pertinent past medical history.  Patient Active Problem List   Diagnosis Date Noted  . MDD (major depressive disorder), recurrent episode, moderate (HCC)   . Depression 03/18/2018  . Chlamydia 11/29/2015  . Irregular menses 11/26/2015  . Encounter for Depo-Provera contraception 11/26/2015  . Right ankle sprain 05/16/2011    History reviewed. No pertinent surgical history.  OB History   No obstetric history on file.      Home Medications    Prior to Admission medications   Medication Sig Start Date End Date Taking? Authorizing Provider  fluconazole (DIFLUCAN) 150 MG tablet Take 1 tablet (150 mg total) by mouth daily. Take 2nd dose if still symptoms 3 days after first dose. 06/25/19   Georges Mouse, NP  ibuprofen (ADVIL) 600 MG tablet Take 1  tablet (600 mg total) by mouth every 6 (six) hours as needed. 02/05/20   Selden Noteboom, Veryl Speak, PA-C  metroNIDAZOLE (FLAGYL) 500 MG tablet Take 1 tablet (500 mg total) by mouth 2 (two) times daily. 06/25/19   Georges Mouse, NP  tiZANidine (ZANAFLEX) 4 MG tablet Take 1 tablet (4 mg total) by mouth at bedtime for 7 days. 02/05/20 02/12/20  Avianah Pellman, Veryl Speak, PA-C    Family History Family History  Problem Relation Age of Onset  . Diabetes Father   . Hypertension Father   . Asthma Mother   . Hypertension Maternal Aunt   . Cancer Maternal Aunt   . Hypertension Maternal Uncle   . Diabetes Paternal Aunt   . Diabetes Paternal Uncle   . Hypertension Maternal Grandmother   . Cancer Maternal Grandmother   . Hypertension Paternal Grandmother   . Heart attack Neg Hx   . Hyperlipidemia Neg Hx   . Sudden death Neg Hx     Social History Social History   Tobacco Use  . Smoking status: Never Smoker  . Smokeless tobacco: Never Used  Substance Use Topics  . Alcohol use: No    Alcohol/week: 0.0 standard drinks  . Drug use: No     Allergies   Latex   Review of Systems Review of Systems   Physical Exam Triage Vital Signs  ED Triage Vitals  Enc Vitals Group     BP 02/05/20 1420 (!) 89/57     Pulse Rate 02/05/20 1420 69     Resp 02/05/20 1420 16     Temp 02/05/20 1420 98.5 F (36.9 C)     Temp Source 02/05/20 1420 Oral     SpO2 02/05/20 1420 100 %     Weight --      Height --      Head Circumference --      Peak Flow --      Pain Score 02/05/20 1418 6     Pain Loc --      Pain Edu? --      Excl. in [place]? --    No data found.  Updated Vital Signs BP (!) 89/57 (BP Location: Right Arm)   Pulse 69   Temp 98.5 F (36.9 C) (Oral)   Resp 16   LMP 01/10/2020   SpO2 100%   Visual Acuity Right Eye Distance:   Left Eye Distance:   Bilateral Distance:    Right Eye Near:   Left Eye Near:    Bilateral Near:     Physical Exam Vitals and nursing note reviewed.  Constitutional:       General: She is not in acute distress.    Appearance: She is well-developed. She is not ill-appearing.  HENT:     Head: Normocephalic and atraumatic.  Eyes:     Conjunctiva/sclera: Conjunctivae normal.  Cardiovascular:     Rate and Rhythm: Normal rate and regular rhythm.     Heart sounds: No murmur heard.  No friction rub. No gallop.      Comments: Pulses 2+ equal bilaterally in the upper extremity at the radial Pulmonary:     Effort: Pulmonary effort is normal. No respiratory distress.     Breath sounds: Normal breath sounds.  Musculoskeletal:     Cervical back: Neck supple.     Comments: There is some mild tenderness in the left trapezius musculature and upper thoracic back musculatur on the left side.  There is also tenderness to palpation in the upper chest musculature on the left side.  Pain elicited with cross arm reach, resisted chest flexion, elbow extension as well as reaching above head with the left arm.  Patient has 5/5 strength in the upper extremity.  Sensation equally bilaterally.  There is no cervical spine midline tenderness or thoracic or lumbar midline tenderness.  Skin:    General: Skin is warm and dry.     Capillary Refill: Capillary refill takes less than 2 seconds.  Neurological:     General: No focal deficit present.     Mental Status: She is alert and oriented to person, place, and time.      UC Treatments / Results  Labs (all labs ordered are listed, but only abnormal results are displayed) Labs Reviewed - No data to display  EKG   Radiology No results found.  Procedures Procedures (including critical care time)  Medications Ordered in UC Medications - No data to display  Initial Impression / Assessment and Plan / UC Course  I have reviewed the triage vital signs and the nursing notes.  Pertinent labs & imaging results that were available during my care of the patient were reviewed by me and considered in my medical decision making (see chart  for details).     #Upper back pain #Chest wall muscle strain Patient is a 24 year old presenting with chest wall strain  and upper back strain.  Doubt intrathoracic given reproducibility with physical exertion.  Likely muscle strain.  Will treat with NSAIDs and short course of muscle relaxer.  Also encouraged to continue use of topicals if this benefits.  Sports medicine follow-up if not improving.  Recommended primary care establishment.  Return emergency department precautions were discussed.  Patient verbalized understanding. Final Clinical Impressions(s) / UC Diagnoses   Final diagnoses:  Upper back pain  Muscle strain of chest wall, initial encounter     Discharge Instructions     I think you strained the muscles in your upper back and chest.  Want you to take ibuprofen every 6 hours as needed for the pain Take the Zanaflex at night, this will make you sleepy so do not drive, operate machinery or drink alcohol within 8 hours of taking this.  Pain becomes more severe in your chest area  or you have development of shortness of breath please return or follow-up in the  emergency department  If pain becomes persistent and is just not improving please follow-up with sports medicine  Establish primary care with the family medicine.     ED Prescriptions    Medication Sig Dispense Auth. Provider   ibuprofen (ADVIL) 600 MG tablet Take 1 tablet (600 mg total) by mouth every 6 (six) hours as needed. 30 tablet Johniya Durfee, Veryl Speak, PA-C   tiZANidine (ZANAFLEX) 4 MG tablet Take 1 tablet (4 mg total) by mouth at bedtime for 7 days. 7 tablet Meeah Totino, Veryl Speak, PA-C     PDMP not reviewed this encounter.   Hermelinda Medicus, PA-C 02/05/20 1501

## 2020-06-09 ENCOUNTER — Other Ambulatory Visit: Payer: Self-pay | Admitting: Family

## 2020-06-11 ENCOUNTER — Encounter: Payer: Self-pay | Admitting: Family

## 2020-06-11 ENCOUNTER — Other Ambulatory Visit: Payer: Self-pay

## 2020-06-11 ENCOUNTER — Other Ambulatory Visit (HOSPITAL_COMMUNITY)
Admission: RE | Admit: 2020-06-11 | Discharge: 2020-06-11 | Disposition: A | Discharge status: Home / Self Care | Payer: 59 | Source: Ambulatory Visit | Attending: Family | Admitting: Family

## 2020-06-11 ENCOUNTER — Ambulatory Visit (INDEPENDENT_AMBULATORY_CARE_PROVIDER_SITE_OTHER): Payer: 59

## 2020-06-11 VITALS — BP 104/74 | HR 94 | Ht 60.0 in | Wt 118.0 lb

## 2020-06-11 DIAGNOSIS — B3749 Other urogenital candidiasis: Secondary | ICD-10-CM | POA: Insufficient documentation

## 2020-06-11 DIAGNOSIS — Z113 Encounter for screening for infections with a predominantly sexual mode of transmission: Secondary | ICD-10-CM

## 2020-06-11 DIAGNOSIS — N898 Other specified noninflammatory disorders of vagina: Secondary | ICD-10-CM

## 2020-06-11 NOTE — Progress Notes (Signed)
Patient came into office for swab collection due to increase vaginal discharge with itching. No burning with urination. Sending off for infection per standing orders.

## 2020-06-13 LAB — WET PREP BY MOLECULAR PROBE
Candida species: DETECTED — AB
MICRO NUMBER:: 11077382
SPECIMEN QUALITY:: ADEQUATE
Trichomonas vaginosis: NOT DETECTED

## 2020-06-13 LAB — C. TRACHOMATIS/N. GONORRHOEAE RNA
C. trachomatis RNA, TMA: NOT DETECTED
N. gonorrhoeae RNA, TMA: NOT DETECTED

## 2020-06-14 LAB — URINE CYTOLOGY ANCILLARY ONLY
Bacterial Vaginitis-Urine: NEGATIVE
Candida Urine: POSITIVE — AB
Chlamydia: NEGATIVE
Comment: NEGATIVE
Comment: NEGATIVE
Comment: NORMAL
Neisseria Gonorrhea: NEGATIVE
Trichomonas: NEGATIVE

## 2020-06-15 ENCOUNTER — Other Ambulatory Visit: Payer: Self-pay | Admitting: Family

## 2020-06-15 MED ORDER — METRONIDAZOLE 500 MG PO TABS: 500.0000 mg | ORAL_TABLET | Freq: Two times a day (BID) | ORAL | 0 refills | Status: DC

## 2020-06-17 ENCOUNTER — Ambulatory Visit (INDEPENDENT_AMBULATORY_CARE_PROVIDER_SITE_OTHER): Payer: 59 | Admitting: Pediatrics

## 2020-06-17 ENCOUNTER — Encounter: Payer: Self-pay | Admitting: Pediatrics

## 2020-06-17 ENCOUNTER — Other Ambulatory Visit: Payer: Self-pay

## 2020-06-17 VITALS — BP 99/73 | HR 87 | Ht 60.63 in | Wt 116.2 lb

## 2020-06-17 DIAGNOSIS — N898 Other specified noninflammatory disorders of vagina: Secondary | ICD-10-CM

## 2020-06-17 NOTE — Progress Notes (Signed)
History was provided by the patient.  Tamara Rodriguez is a 24 y.o. female who is here for evaluation of a vaginal lesion.  Patient, No Pcp Per   HPI:  Pt reports:   - there is no tingling or burning associated with the lesion - never had a lesion like this before - has not recently been sexually active   Tamara Rodriguez was last seen in adolescent clinic on 06/11/2020 for evaluation of vaginal discharge, that of which was positive for candida. She was prescribed fluconazole and metronidazole to help treat her symptoms.  No LMP recorded.  Review of Systems  Constitutional: Negative for malaise/fatigue.  Eyes: Negative for double vision.  Respiratory: Negative for shortness of breath.   Cardiovascular: Negative for chest pain and palpitations.  Gastrointestinal: Negative for abdominal pain, constipation, diarrhea, nausea and vomiting.  Genitourinary: Negative for dysuria.  Musculoskeletal: Negative for joint pain and myalgias.  Skin: Negative for rash.  Neurological: Negative for dizziness and headaches.  Endo/Heme/Allergies: Does not bruise/bleed easily.    Patient Active Problem List   Diagnosis Date Noted  . MDD (major depressive disorder), recurrent episode, moderate (HCC)   . Depression 03/18/2018  . Chlamydia 11/29/2015  . Irregular menses 11/26/2015  . Encounter for Depo-Provera contraception 11/26/2015  . Right ankle sprain 05/16/2011    Current Outpatient Medications on File Prior to Visit  Medication Sig Dispense Refill  . fluconazole (DIFLUCAN) 150 MG tablet Take 1 tablet (150 mg total) by mouth daily. Take 2nd dose if still symptoms 3 days after first dose. (Patient not taking: Reported on 06/11/2020) 2 tablet 0  . ibuprofen (ADVIL) 600 MG tablet Take 1 tablet (600 mg total) by mouth every 6 (six) hours as needed. 30 tablet 0  . metroNIDAZOLE (FLAGYL) 500 MG tablet Take 1 tablet (500 mg total) by mouth 2 (two) times daily. 14 tablet 0   No current facility-administered  medications on file prior to visit.    Allergies  Allergen Reactions  . Latex Hives    Physical Exam:    Vitals:   06/17/20 1517  BP: 99/73  Pulse: 87  Weight: 116 lb 3.2 oz (52.7 kg)  Height: 5' 0.63" (1.54 m)    Growth percentile SmartLinks can only be used for patients less than 62 years old.  Physical Exam Vitals reviewed. Exam conducted with a chaperone present.  Constitutional:      Appearance: Normal appearance. She is not ill-appearing.  HENT:     Head: Normocephalic.  Pulmonary:     Effort: Pulmonary effort is normal.  Abdominal:     General: Abdomen is flat.  Genitourinary:    Urethra: Urethral lesion present.     Comments: Two small lesions to clitoral hood with mild discomfort with exam  Musculoskeletal:        General: Normal range of motion.  Skin:    General: Skin is warm and dry.     Findings: Lesion present.  Neurological:     General: No focal deficit present.     Mental Status: She is alert and oriented to person, place, and time.  Psychiatric:        Mood and Affect: Mood normal.     Assessment/Plan: 1. Vaginal lesion Lesions do not appear to be herpetic and are isolated to the clitoral hood. I suspect she may have had some additional friction to the area that caused the breaks in the skin. Will swab and test to confirm.   - Herpes simplex virus (HSV),  DNA by PCR  Return as needed   Alfonso Ramus, FNP

## 2020-06-20 LAB — HERPES SIMPLEX VIRUS(HSV) DNA BY PCR
HSV 1 DNA: DETECTED — AB
HSV 2 DNA: NOT DETECTED

## 2020-07-18 ENCOUNTER — Other Ambulatory Visit: Payer: Self-pay

## 2020-07-18 ENCOUNTER — Encounter (HOSPITAL_COMMUNITY): Payer: Self-pay

## 2020-07-18 ENCOUNTER — Ambulatory Visit (HOSPITAL_COMMUNITY)
Admission: EM | Admit: 2020-07-18 | Discharge: 2020-07-18 | Disposition: A | Discharge status: Home / Self Care | Payer: 59

## 2020-07-18 DIAGNOSIS — J3089 Other allergic rhinitis: Secondary | ICD-10-CM | POA: Diagnosis not present

## 2020-07-18 DIAGNOSIS — H1013 Acute atopic conjunctivitis, bilateral: Secondary | ICD-10-CM | POA: Diagnosis not present

## 2020-07-18 NOTE — Discharge Instructions (Signed)
I would have you take Xyzal daily  I would also have you take Zaditor eye drops (you may get the store brand)  Follow up with this office or with primary care if symptoms are persisting.  Follow up in the ER for high fever, trouble swallowing, trouble breathing, other concerning symptoms.

## 2020-07-18 NOTE — ED Triage Notes (Signed)
Pt in with c/o eye irritation and swelling that started on Friday.  Pt has been using eye drops with minimal relief  Redness noted to left sclera  Denies any vision changes

## 2020-07-18 NOTE — ED Provider Notes (Signed)
[facility]   [id number] 07/18/20 Arrival Time: 1202  CC: EYE REDNESS  SUBJECTIVE:  Tamara Rodriguez is a 24 y.o. female who presents with complaint of eye redness that began yesterday. Denies a precipitating event, trauma, or close contacts with similar symptoms. Has not tried OTC medications for this. Symptoms are the worst when awakening in the morning. Denies similar symptoms in the past. Denies fever, chills, nausea, vomiting, eye pain, painful eye movements, halos, discharge, itching, vision changes, double vision, FB sensation, periorbital erythema.  Denies contact lens use.    ROS: As per HPI.  All other pertinent ROS negative.     History reviewed. No pertinent past medical history. History reviewed. No pertinent surgical history. Allergies  Allergen Reactions   Latex Hives   No current facility-administered medications on file prior to encounter.   Current Outpatient Medications on File Prior to Encounter  Medication Sig Dispense Refill   fluconazole (DIFLUCAN) 150 MG tablet Take 1 tablet (150 mg total) by mouth daily. Take 2nd dose if still symptoms 3 days after first dose. (Patient not taking: Reported on 06/11/2020) 2 tablet 0   ibuprofen (ADVIL) 600 MG tablet Take 1 tablet (600 mg total) by mouth every 6 (six) hours as needed. 30 tablet 0   metroNIDAZOLE (FLAGYL) 500 MG tablet Take 1 tablet (500 mg total) by mouth 2 (two) times daily. 14 tablet 0   Social History   Socioeconomic History   Marital status: Single    Spouse name: Not on file   Number of children: Not on file   Years of education: Not on file   Highest education level: Not on file  Occupational History   Not on file  Tobacco Use   Smoking status: Never Smoker   Smokeless tobacco: Never Used  Substance and Sexual Activity   Alcohol use: No    Alcohol/week: 0.0 standard drinks   Drug use: No   Sexual activity: Yes    Birth control/protection: None  Other Topics Concern     Not on file  Social History Narrative   Not on file   Social Determinants of Health   Financial Resource Strain:    Difficulty of Paying Living Expenses: Not on file  Food Insecurity:    Worried About Running Out of Food in the Last Year: Not on file   [employer] of Food in the Last Year: Not on file  Transportation Needs:    Lack of Transportation (Medical): Not on file   Lack of Transportation (Non-Medical): Not on file  Physical Activity:    Days of Exercise per Week: Not on file   Minutes of Exercise per Session: Not on file  Stress:    Feeling of Stress : Not on file  Social Connections:    Frequency of Communication with Friends and Family: Not on file   Frequency of Social Gatherings with Friends and Family: Not on file   Attends Religious Services: Not on file   Active Member of Clubs or Organizations: Not on file   Attends Banker Meetings: Not on file   Marital Status: Not on file  Intimate Partner Violence:    Fear of Current or Ex-Partner: Not on file   Emotionally Abused: Not on file   Physically Abused: Not on file   Sexually Abused: Not on file   Family History  Problem Relation Age of Onset   Diabetes Father    Hypertension Father    Asthma Mother  Hypertension Maternal Aunt    Cancer Maternal Aunt    Hypertension Maternal Uncle    Diabetes Paternal Aunt    Diabetes Paternal Uncle    Hypertension Maternal Grandmother    Cancer Maternal Grandmother    Hypertension Paternal Grandmother    Heart attack Neg Hx    Hyperlipidemia Neg Hx    Sudden death Neg Hx     OBJECTIVE:    Visual Acuity  Right Eye Distance: 20/20 Left Eye Distance: 20/15 Bilateral Distance: 20/15      Vitals:   07/18/20 1232 07/18/20 1235  BP:  96/69  Pulse: 92   Resp: 18   Temp: 98.4 F (36.9 C)   TempSrc: Oral   SpO2: 98%     General appearance: alert; no distress Eyes: No conjunctival erythema. PERRL; EOMI without  discomfort;  no obvious drainage; lid everted without obvious FB; no obvious fluorescein uptake  Neck: supple Lungs: clear to auscultation bilaterally Heart: regular rate and rhythm Skin: warm and dry, bilateral allergic shiners present Psychological: alert and cooperative; normal mood and affect   ASSESSMENT & PLAN:  1. Allergic conjunctivitis of both eyes   2. Seasonal allergic rhinitis due to other allergic trigger    Allergic Conjunctivitis May use Xyzal daily  May also use Zaditor eye drops Dispose of old contacts and wear glasses  Wash pillow cases, wash hands regularly with soap and water, avoid touching your face and eyes, wash door handles, light switches, remotes and other objects you frequently touch Return or follow up with PCP if symptoms persists such as fever, chills, redness, swelling, eye pain, painful eye movements, vision changes   Reviewed expectations re: course of current medical issues. Questions answered. Outlined signs and symptoms indicating need for more acute intervention. Patient verbalized understanding. After Visit Summary given.   Moshe Cipro, NP 07/18/20 1526

## 2020-11-04 ENCOUNTER — Ambulatory Visit (INDEPENDENT_AMBULATORY_CARE_PROVIDER_SITE_OTHER): Payer: 59 | Admitting: Pediatrics

## 2020-11-04 ENCOUNTER — Encounter: Payer: Self-pay | Admitting: Family

## 2020-11-04 ENCOUNTER — Other Ambulatory Visit: Payer: Self-pay

## 2020-11-04 VITALS — BP 98/68 | HR 82 | Ht 60.63 in | Wt 111.8 lb

## 2020-11-04 DIAGNOSIS — Z113 Encounter for screening for infections with a predominantly sexual mode of transmission: Secondary | ICD-10-CM | POA: Diagnosis not present

## 2020-11-04 DIAGNOSIS — Z3042 Encounter for surveillance of injectable contraceptive: Secondary | ICD-10-CM

## 2020-11-04 DIAGNOSIS — Z111 Encounter for screening for respiratory tuberculosis: Secondary | ICD-10-CM

## 2020-11-04 DIAGNOSIS — N926 Irregular menstruation, unspecified: Secondary | ICD-10-CM

## 2020-11-04 DIAGNOSIS — Z3202 Encounter for pregnancy test, result negative: Secondary | ICD-10-CM

## 2020-11-04 LAB — POCT URINE PREGNANCY: Preg Test, Ur: NEGATIVE

## 2020-11-04 MED ORDER — MEDROXYPROGESTERONE ACETATE 150 MG/ML IM SUSP
150.0000 mg | Freq: Once | INTRAMUSCULAR | Status: AC
  Administered 2020-11-04: 150 mg via INTRAMUSCULAR

## 2020-11-04 NOTE — Patient Instructions (Signed)
Return in 4 weeks for a pap  Labs today to help assess irregular cycles  Restart depo  I will send your TB screening results on mychart and you can print and turn in

## 2020-11-04 NOTE — Progress Notes (Unsigned)
History was provided by the patient.  Tamara Rodriguez is a 25 y.o. female who is here to discuss birth control and work health assessment forms.  PCP confirmed? No.  Patient, No Pcp Per   HPI:    She reports that she is starting a new job and needs a TB test in addition to physical exam forms completed. Denies previous exposures to individuals with TB, homelessness, incarceration, recent travel, living outside of the [place] for >1 month, or being born outside of the [place]. Denies unexplained prolonged cough, fevers, night sweats, or weight loss.  Menarche in 5th grade. Always has had irregular periods. Usually last 5-6 days, heavy flow (changing pads/tampons 2-3x daily). Currently menstruating, has lasted 3 weeks, changing tampons twice per day. Previously on Depo, has not had in ~2 years. She was previously sexually active, not currently. Has been treated for STDs in the past. Denies changes in vaginal discharge, no pelvic pain. No history of easy bleeding or bruising, no family history of bleeding disorders.  Review of Systems  Constitutional: Negative for chills, diaphoresis, fever, malaise/fatigue and weight loss.  HENT: Negative for congestion, ear discharge, nosebleeds, sore throat and tinnitus.   Eyes: Negative for blurred vision, double vision and redness.  Respiratory: Negative for cough, shortness of breath and wheezing.   Cardiovascular: Negative for chest pain and palpitations.  Gastrointestinal: Negative for abdominal pain, constipation, diarrhea, heartburn, nausea and vomiting.  Genitourinary: Negative for dysuria, flank pain, frequency, hematuria and urgency.  Musculoskeletal: Negative for back pain, myalgias and neck pain.  Skin: Negative for rash.  Neurological: Negative for dizziness, weakness and headaches.  Endo/Heme/Allergies: Does not bruise/bleed easily.  Psychiatric/Behavioral: Negative for depression. The patient is not nervous/anxious.     Patient Active Problem List    Diagnosis Date Noted  . MDD (major depressive disorder), recurrent episode, moderate (HCC)   . Depression 03/18/2018  . Chlamydia 11/29/2015  . Irregular menses 11/26/2015  . Encounter for Depo-Provera contraception 11/26/2015  . Right ankle sprain 05/16/2011    No current outpatient medications on file prior to visit.   No current facility-administered medications on file prior to visit.    Allergies  Allergen Reactions  . Latex Hives    Physical Exam:    Vitals:   11/04/20 0859  BP: 98/68  Pulse: 82  Weight: 111 lb 12.8 oz (50.7 kg)  Height: 5' 0.63" (1.54 m)    Growth percentile SmartLinks can only be used for patients less than 70 years old. Patient's last menstrual period was 10/14/2020 (approximate).  Physical Exam Constitutional:      General: She is not in acute distress.    Appearance: Normal appearance.  HENT:     Head: Normocephalic.     Right Ear: External ear normal.     Left Ear: External ear normal.     Nose: Nose normal. No congestion or rhinorrhea.     Mouth/Throat:     Mouth: Mucous membranes are moist.     Pharynx: Oropharynx is clear. No oropharyngeal exudate or posterior oropharyngeal erythema.  Eyes:     Extraocular Movements: Extraocular movements intact.     Conjunctiva/sclera: Conjunctivae normal.     Pupils: Pupils are equal, round, and reactive to light.  Cardiovascular:     Rate and Rhythm: Normal rate and regular rhythm.     Pulses: Normal pulses.     Heart sounds: Normal heart sounds. No murmur heard.   Pulmonary:     Effort: Pulmonary effort is normal.  No respiratory distress.     Breath sounds: Normal breath sounds. No wheezing or rales.  Abdominal:     General: Bowel sounds are normal. There is no distension.     Palpations: Abdomen is soft.     Tenderness: There is no abdominal tenderness.  Musculoskeletal:        General: No swelling or tenderness. Normal range of motion.     Cervical back: Normal range of motion and  neck supple.  Lymphadenopathy:     Cervical: No cervical adenopathy.  Skin:    General: Skin is warm and dry.     Capillary Refill: Capillary refill takes less than 2 seconds.  Neurological:     Mental Status: She is alert and oriented to person, place, and time.  Psychiatric:        Mood and Affect: Mood normal.     Assessment/Plan: 1. Pregnancy examination or test, negative result - POCT urine pregnancy  2. Irregular menses: Reports irregular periods since menarche. Previous lab work completed in 2017 suggestive of PCOS. Will repeat labs today. Also has history of STDs; active infection could contribute to prolonged menstruation this cycle. - LH - Follicle stimulating hormone - Testos,Total,Free and SHBG (Female) - Prolactin - TSH - T4, free - DHEA-sulfate - Urine cytology ancillary only  3. Routine screening for STI (sexually transmitted infection) - HIV antibody (with reflex)  4. Screening-pulmonary TB: needed for work; instructed patient that results will be sent via MyChart and she can print this as proof of negative results for work. Patient starting new job and needed health assessment completed. Reassuring physical exam, forms completed without restrictions. - QuantiFERON-TB Gold Plus  5. Encounter for management and injection of depo-Provera: Previously on Depo, last ~2 years ago. Stopped because she didn't get her period on Depo and this made her uncomfortable. Discussed options including OCPs, Depo, LARC and patient prefers to restart Depo.  - medroxyPROGESTERone (DEPO-PROVERA) injection 150 mg - Return in 3 months for next dose  6. Health Maintenance: Encouraged patient to establish care with an adult provider.     Susy Frizzle, MD

## 2020-11-05 NOTE — Progress Notes (Signed)
I have reviewed the resident's note and plan of care and helped develop the plan as necessary.  Needs to establish with adult care. Restart depo today. Will return in 4 weeks to repeat PAP which was positive for LGSIL in 2019. Having bleeding today, so not an ideal day to collect a specimen.  Alfonso Ramus, FNP

## 2020-11-06 LAB — QUANTIFERON-TB GOLD PLUS
Mitogen-NIL: >10 IU/mL
NIL: 0.02 IU/mL
QuantiFERON-TB Gold Plus: NEGATIVE
TB1-NIL: 0 IU/mL
TB2-NIL: 0 IU/mL

## 2020-11-08 LAB — HIV ANTIBODY (ROUTINE TESTING W REFLEX): HIV 1&2 Ab, 4th Generation: NONREACTIVE

## 2020-11-08 LAB — TESTOS,TOTAL,FREE AND SHBG (FEMALE)
Free Testosterone: 6 pg/mL (ref 0.1–6.4)
Sex Hormone Binding: 65 nmol/L (ref 17–124)
Testosterone, Total, LC-MS-MS: 60 ng/dL — ABNORMAL HIGH (ref 2–45)

## 2020-11-08 LAB — FOLLICLE STIMULATING HORMONE: FSH: 6.2 m[IU]/mL

## 2020-11-08 LAB — LUTEINIZING HORMONE: LH: 10.1 m[IU]/mL

## 2020-11-08 LAB — PROLACTIN: Prolactin: 8.2 ng/mL

## 2020-11-08 LAB — T4, FREE: Free T4: 1 ng/dL (ref 0.8–1.8)

## 2020-11-08 LAB — DHEA-SULFATE: DHEA-SO4: 97 ug/dL (ref 14–349)

## 2020-11-08 LAB — TSH: TSH: 1.32 mIU/L

## 2020-11-08 LAB — EXTRA LAV TOP TUBE

## 2020-12-02 ENCOUNTER — Encounter: Payer: Self-pay | Admitting: Pediatrics

## 2020-12-02 ENCOUNTER — Other Ambulatory Visit (HOSPITAL_COMMUNITY)
Admission: RE | Admit: 2020-12-02 | Discharge: 2020-12-02 | Disposition: A | Discharge status: Home / Self Care | Payer: 59 | Source: Ambulatory Visit | Attending: Pediatrics | Admitting: Pediatrics

## 2020-12-02 ENCOUNTER — Other Ambulatory Visit: Payer: Self-pay

## 2020-12-02 ENCOUNTER — Encounter: Payer: Self-pay | Admitting: *Deleted

## 2020-12-02 ENCOUNTER — Ambulatory Visit (INDEPENDENT_AMBULATORY_CARE_PROVIDER_SITE_OTHER): Payer: 59 | Admitting: Pediatrics

## 2020-12-02 VITALS — BP 96/68 | HR 85 | Ht 60.63 in | Wt 114.4 lb

## 2020-12-02 DIAGNOSIS — R87612 Low grade squamous intraepithelial lesion on cytologic smear of cervix (LGSIL): Secondary | ICD-10-CM | POA: Diagnosis not present

## 2020-12-02 DIAGNOSIS — Z124 Encounter for screening for malignant neoplasm of cervix: Secondary | ICD-10-CM | POA: Diagnosis not present

## 2020-12-02 DIAGNOSIS — N888 Other specified noninflammatory disorders of cervix uteri: Secondary | ICD-10-CM | POA: Diagnosis not present

## 2020-12-02 DIAGNOSIS — N898 Other specified noninflammatory disorders of vagina: Secondary | ICD-10-CM | POA: Diagnosis not present

## 2020-12-02 NOTE — Patient Instructions (Addendum)
We performed a pap smear today with STI testing including gonorrhea, chlamydia, trichomonas, yeast, BV (bacterial vaginosis), and HPV. We will notify you with the results through MyChart.   Use a water based lubricant such as KY Jelly with sexual intercourse.

## 2020-12-02 NOTE — Progress Notes (Signed)
History was provided by the patient.  Tamara Rodriguez is a 25 y.o. female who is here for repeat Pap smear. Has history of abnormal pap Nov 2019. History of HSV and chlamydia.  Patient, No Pcp Per (Inactive)   HPI:   - HPV vaccines - completed series in 2009 and 2010 - Birth control - depo received a few weeks ago and stopped menstrual bleeding.  - LMP end of February and lasted 1 month  - No new sexual partners. Has same female partner that has been with for the past year. Uses condoms inconsistently. - Reports spotting after vaginal intercourse. Denies vaginal discharge. Reports some irritation with intercourse. Not using any lubricant. No pain with intercourse. Denies dysuria, increased frequency or urgency. Denies any lesions.  - Reports feeling safe with partner.  - Denies abdominal pain. - GC/CT, trich, HPV  ROS as above, otherwise negative.   Patient Active Problem List   Diagnosis Date Noted  . MDD (major depressive disorder), recurrent episode, moderate (HCC)   . Depression 03/18/2018  . Chlamydia 11/29/2015  . Irregular menses 11/26/2015  . Encounter for Depo-Provera contraception 11/26/2015  . Right ankle sprain 05/16/2011    No current outpatient medications on file prior to visit.   No current facility-administered medications on file prior to visit.    Allergies  Allergen Reactions  . Latex Hives    Social History: Confidentiality was discussed with the patient and if applicable, with caregiver as well. Tobacco: not asked Secondhand smoke exposure? Not asked Drugs/EtOH: not asked Sexually active? yes - 1 female partner for past year  Safety: no concerns today Last STI Screening: 11/04/20: HIV. 06/17/20 HSV, wet prep, GC/CT. Will obtain testing today. Pregnancy Prevention: Depo-provera q3 months   Physical Exam:    Vitals:   12/02/20 0913  BP: 96/68  Pulse: 85  Weight: 114 lb 6.4 oz (51.9 kg)  Height: 5' 0.63" (1.54 m)    Growth percentile SmartLinks  can only be used for patients less than 79 years old.  Physical Exam Exam conducted with a chaperone present.  Constitutional:      General: She is not in acute distress. Pulmonary:     Effort: Pulmonary effort is normal.  Genitourinary:    General: Normal vulva.     Exam position: Lithotomy position.     Pubic Area: No rash.      Labia:        Right: No rash.        Left: No rash.      Vagina: Normal. No lesions.     Cervix: Discharge (white), friability and cervical bleeding present. No cervical motion tenderness or lesion.     Adnexa:        Right: No tenderness.         Left: No tenderness.    Skin:    General: Skin is warm.     Findings: No rash.  Neurological:     General: No focal deficit present.     Mental Status: She is alert.  Psychiatric:        Mood and Affect: Mood normal.        Behavior: Behavior normal.        Thought Content: Thought content normal.        Judgment: Judgment normal.     Assessment/Plan: Tamara Rodriguez is a 25 yo F sexually active presenting today for repeat pap smear and STI testing. Has history of abnormal pap in Nov 2019 with low  grade squamous intraepithelial lesion. Has completed HPV vaccine course. On pelvic exam, had friable cervix with bleeding from pap with some white cervical discharge. On bimanual exam had no CMT or adnexal tenderness.  Has been sexually active with the same female partner for the past year. Using condoms inconsistently. Using depo-provera for pregnancy prevention. Reports slight irritation and spotting with intercourse but denies pain, significant bleeding or vaginal discharge. Recommended a water-based lubricant with sexual intercourse.  History of LGSIL on pap with friable cervix - Pap smear performed today - Sent GC/CT, trichomonas, wet prep, HPV. Will treat based on results.  - Recommended lubricant with sexual intercourse - Continue depo-provera q3 months  Clair Gulling, MD [facility] PGY-3

## 2020-12-02 NOTE — Progress Notes (Signed)
I have reviewed the resident's note and plan of care and helped develop the plan as necessary.  I chaperoned exam. Specimens collected. Cervix friable, but with no other red flags for PID, will await testing and treat accordingly. Patient in agreemeent.   Alfonso Ramus, FNP

## 2020-12-03 LAB — CYTOLOGY - PAP
Chlamydia: NEGATIVE
Comment: NEGATIVE
Comment: NEGATIVE
Comment: NORMAL
Diagnosis: NEGATIVE
Neisseria Gonorrhea: NEGATIVE
Trichomonas: NEGATIVE

## 2020-12-03 LAB — WET PREP BY MOLECULAR PROBE
Candida species: NOT DETECTED
MICRO NUMBER:: 11743375
SPECIMEN QUALITY:: ADEQUATE
Trichomonas vaginosis: NOT DETECTED

## 2020-12-06 ENCOUNTER — Other Ambulatory Visit: Payer: Self-pay | Admitting: Pediatrics

## 2020-12-06 MED ORDER — METRONIDAZOLE 500 MG PO TABS: 500.0000 mg | ORAL_TABLET | Freq: Two times a day (BID) | ORAL | 0 refills | Status: AC

## 2021-01-18 ENCOUNTER — Ambulatory Visit: Payer: 59

## 2021-01-27 ENCOUNTER — Ambulatory Visit: Payer: 59

## 2021-01-31 ENCOUNTER — Ambulatory Visit (INDEPENDENT_AMBULATORY_CARE_PROVIDER_SITE_OTHER): Payer: 59

## 2021-01-31 ENCOUNTER — Other Ambulatory Visit: Payer: Self-pay

## 2021-01-31 DIAGNOSIS — Z3042 Encounter for surveillance of injectable contraceptive: Secondary | ICD-10-CM

## 2021-01-31 MED ORDER — MEDROXYPROGESTERONE ACETATE 150 MG/ML IM SUSP
150.0000 mg | Freq: Once | INTRAMUSCULAR | Status: AC
  Administered 2021-01-31: 150 mg via INTRAMUSCULAR

## 2021-01-31 NOTE — Progress Notes (Signed)
Pt presents for depo injection. Pt within depo window, no urine hcg needed. Injection given, tolerated well. F/u depo injection visit scheduled.   

## 2021-04-07 ENCOUNTER — Other Ambulatory Visit: Payer: Self-pay

## 2021-04-07 ENCOUNTER — Encounter: Payer: Self-pay | Admitting: Emergency Medicine

## 2021-04-07 ENCOUNTER — Ambulatory Visit
Admission: EM | Admit: 2021-04-07 | Discharge: 2021-04-07 | Disposition: A | Discharge status: Home / Self Care | Payer: 59 | Attending: Emergency Medicine | Admitting: Emergency Medicine

## 2021-04-07 DIAGNOSIS — R197 Diarrhea, unspecified: Secondary | ICD-10-CM | POA: Diagnosis not present

## 2021-04-07 DIAGNOSIS — R110 Nausea: Secondary | ICD-10-CM | POA: Diagnosis not present

## 2021-04-07 DIAGNOSIS — F411 Generalized anxiety disorder: Secondary | ICD-10-CM

## 2021-04-07 HISTORY — DX: Depression, unspecified: F32.A

## 2021-04-07 HISTORY — DX: Anxiety disorder, unspecified: F41.9

## 2021-04-07 MED ORDER — HYDROXYZINE HCL 25 MG PO TABS: 25.0000 mg | ORAL_TABLET | Freq: Four times a day (QID) | ORAL | 0 refills | Status: AC | PRN

## 2021-04-07 MED ORDER — ONDANSETRON 4 MG PO TBDP: 4.0000 mg | ORAL_TABLET | Freq: Three times a day (TID) | ORAL | 0 refills | Status: DC | PRN

## 2021-04-07 NOTE — ED Provider Notes (Signed)
[facility]    CSN: [id number] Arrival date & time: 04/07/21  1224      History   Chief Complaint Chief Complaint  Patient presents with   Abdominal Pain   Nausea    HPI Tamara Rodriguez is a 25 y.o. female presenting today for evaluation of abdominal pain.  Reports abdominal pain nausea and diarrhea x1 to 2 days.  Symptoms began yesterday.  Denies any significant abdominal pain, mainly uneasiness.  Denies vomiting.  Denies fevers chills or body aches.  Denies URI symptoms.  Does report increased stress recently and is concerned of symptoms could be correlated.  She reports a panic attack over the weekend.  Does not take any medicines for anxiety.  HPI  Past Medical History:  Diagnosis Date   Anxiety    Depression     Patient Active Problem List   Diagnosis Date Noted   LGSIL on Pap smear of cervix 12/02/2020   MDD (major depressive disorder), recurrent episode, moderate (HCC)    Depression 03/18/2018   Chlamydia 11/29/2015   Irregular menses 11/26/2015   Encounter for Depo-Provera contraception 11/26/2015   Right ankle sprain 05/16/2011    History reviewed. No pertinent surgical history.  OB History   No obstetric history on file.      Home Medications    Prior to Admission medications   Medication Sig Start Date End Date Taking? Authorizing Provider  hydrOXYzine (ATARAX/VISTARIL) 25 MG tablet Take 1 tablet (25 mg total) by mouth every 6 (six) hours as needed for anxiety. 04/07/21  Yes Aalina Brege C, PA-C  ondansetron (ZOFRAN ODT) 4 MG disintegrating tablet Take 1 tablet (4 mg total) by mouth every 8 (eight) hours as needed for nausea or vomiting. 04/07/21  Yes Symantha Steeber, [place] C, PA-C    Family History Family History  Problem Relation Age of Onset   Diabetes Father    Hypertension Father    Asthma Mother    Hypertension Maternal Aunt    Cancer Maternal Aunt    Hypertension Maternal Uncle    Diabetes Paternal Aunt    Diabetes Paternal Uncle     Hypertension Maternal Grandmother    Cancer Maternal Grandmother    Hypertension Paternal Grandmother    Heart attack Neg Hx    Hyperlipidemia Neg Hx    Sudden death Neg Hx     Social History Social History   Tobacco Use   Smoking status: Never   Smokeless tobacco: Never  Substance Use Topics   Alcohol use: No    Alcohol/week: 0.0 standard drinks   Drug use: No     Allergies   Latex   Review of Systems Review of Systems  Constitutional:  Positive for appetite change. Negative for fever.  Respiratory:  Negative for shortness of breath.   Cardiovascular:  Negative for chest pain.  Gastrointestinal:  Positive for abdominal pain, diarrhea and nausea. Negative for vomiting.  Genitourinary:  Negative for dysuria, flank pain, genital sores, hematuria, menstrual problem, vaginal bleeding, vaginal discharge and vaginal pain.  Musculoskeletal:  Negative for back pain.  Skin:  Negative for rash.  Neurological:  Negative for dizziness, light-headedness and headaches.    Physical Exam Triage Vital Signs ED Triage Vitals  Enc Vitals Group     BP 04/07/21 1235 108/71     Pulse Rate 04/07/21 1235 90     Resp 04/07/21 1235 18     Temp 04/07/21 1235 98.7 F (37.1 C)     Temp Source 04/07/21  1235 Oral     SpO2 04/07/21 1235 98 %     Weight --      Height --      Head Circumference --      Peak Flow --      Pain Score 04/07/21 1238 8     Pain Loc --      Pain Edu? --      Excl. in GC? --    No data found.  Updated Vital Signs BP 108/71   Pulse 90   Temp 98.7 F (37.1 C) (Oral)   Resp 18   SpO2 98%   Visual Acuity Right Eye Distance:   Left Eye Distance:   Bilateral Distance:    Right Eye Near:   Left Eye Near:    Bilateral Near:     Physical Exam Vitals and nursing note reviewed.  Constitutional:      Appearance: She is well-developed.     Comments: No acute distress  HENT:     Head: Normocephalic and atraumatic.     Nose: Nose normal.  Eyes:      Conjunctiva/sclera: Conjunctivae normal.  Cardiovascular:     Rate and Rhythm: Normal rate and regular rhythm.  Pulmonary:     Effort: Pulmonary effort is normal. No respiratory distress.     Comments: Breathing comfortably at rest, CTABL, no wheezing, rales or other adventitious sounds auscultated  Abdominal:     General: There is no distension.     Comments: Soft, nondistended, nontender to light and deep palpation throughout abdomen  Musculoskeletal:        General: Normal range of motion.     Cervical back: Neck supple.  Skin:    General: Skin is warm and dry.  Neurological:     Mental Status: She is alert and oriented to person, place, and time.     UC Treatments / Results  Labs (all labs ordered are listed, but only abnormal results are displayed) Labs Reviewed  SARS CORONAVIRUS 2 (TAT 6-24 HRS)  NOVEL CORONAVIRUS, NAA    EKG   Radiology No results found.  Procedures Procedures (including critical care time)  Medications Ordered in UC Medications - No data to display  Initial Impression / Assessment and Plan / UC Course  I have reviewed the triage vital signs and the nursing notes.  Pertinent labs & imaging results that were available during my care of the patient were reviewed by me and considered in my medical decision making (see chart for details).    1 day of nausea and diarrhea, possible correlation anxiety versus viral etiology.  Recommend symptomatic and supportive care rest and fluids and close monitoring.  COVID test pending, for screening per patient request.  Discussed strict return precautions. Patient verbalized understanding and is agreeable with plan.  Final Clinical Impressions(s) / UC Diagnoses   Final diagnoses:  Nausea without vomiting  Diarrhea, unspecified type  Anxiety state     Discharge Instructions      Please use Zofran dissolved in mouth as needed for nausea/vomiting Please drink plenty of water and fluids and gradually  transition diet back to normal Monitor for bowel movements to return to baseline May trial hydroxyzine as needed when feeling anxious-will cause drowsiness, do not drive or work after taking Please establish care with primary care, contact below     ED Prescriptions     Medication Sig Dispense Auth. Provider   ondansetron (ZOFRAN ODT) 4 MG disintegrating tablet Take 1 tablet (4  mg total) by mouth every 8 (eight) hours as needed for nausea or vomiting. 20 tablet Mercia Dowe C, PA-C   hydrOXYzine (ATARAX/VISTARIL) 25 MG tablet Take 1 tablet (25 mg total) by mouth every 6 (six) hours as needed for anxiety. 12 tablet Madisynn Plair, [place] C, PA-C      PDMP not reviewed this encounter.   Lew Dawes, PA-C 04/07/21 1430

## 2021-04-07 NOTE — ED Triage Notes (Signed)
Pt is present today with abdominal pain, nausea, diarrhea, and loss of appetite. Pt states that she has been under a lot of stress and wonder if her sx could be coming from stress. Pt states that her sx started yesterday

## 2021-04-07 NOTE — Discharge Instructions (Addendum)
Please use Zofran dissolved in mouth as needed for nausea/vomiting Please drink plenty of water and fluids and gradually transition diet back to normal Monitor for bowel movements to return to baseline May trial hydroxyzine as needed when feeling anxious-will cause drowsiness, do not drive or work after taking Please establish care with primary care, contact below

## 2021-04-09 LAB — NOVEL CORONAVIRUS, NAA: SARS-CoV-2, NAA: NOT DETECTED

## 2021-04-09 LAB — SARS-COV-2, NAA 2 DAY TAT

## 2021-04-18 ENCOUNTER — Ambulatory Visit: Payer: 59

## 2021-05-11 ENCOUNTER — Other Ambulatory Visit: Payer: Self-pay

## 2021-05-11 ENCOUNTER — Ambulatory Visit
Admission: EM | Admit: 2021-05-11 | Discharge: 2021-05-11 | Disposition: A | Discharge status: Home / Self Care | Payer: 59 | Attending: Family Medicine | Admitting: Family Medicine

## 2021-05-11 DIAGNOSIS — A6004 Herpesviral vulvovaginitis: Secondary | ICD-10-CM | POA: Diagnosis not present

## 2021-05-11 MED ORDER — VALACYCLOVIR HCL 1 G PO TABS
1000.0000 mg | ORAL_TABLET | Freq: Two times a day (BID) | ORAL | 1 refills | Status: AC
  Filled 2021-06-07: qty 14, 7d supply, fill #0

## 2021-05-11 NOTE — ED Triage Notes (Addendum)
Pt states she might be having an HSV outbreak (to vaginal area) for 3 days and is requesting medication for it.

## 2021-05-11 NOTE — ED Provider Notes (Signed)
[facility]    CSN: [id number] Arrival date & time: 05/11/21  1020      History   Chief Complaint Chief Complaint  Patient presents with   vaginal discomfort    HPI Tamara Rodriguez is a 25 y.o. female.   Patient presenting today with vaginal irritation for the past 3 days and notice of some small ulcerations at the base of the introitus.  She states she had a similar outbreak last fall and was diagnosed with genital herpes which cleared on acyclovir.  She feels this is very similar to this incident.  She denies vaginal discharge, dysuria, hematuria, pelvic or flank pain, nausea, vomiting, fevers.  No new hygiene products used.  Trying some over-the-counter remedies with minimal relief.  No new sexual partners or concern for other STDs.   Past Medical History:  Diagnosis Date   Anxiety    Depression     Patient Active Problem List   Diagnosis Date Noted   LGSIL on Pap smear of cervix 12/02/2020   MDD (major depressive disorder), recurrent episode, moderate (HCC)    Depression 03/18/2018   Chlamydia 11/29/2015   Irregular menses 11/26/2015   Encounter for Depo-Provera contraception 11/26/2015   Right ankle sprain 05/16/2011    History reviewed. No pertinent surgical history.  OB History   No obstetric history on file.      Home Medications    Prior to Admission medications   Medication Sig Start Date End Date Taking? Authorizing Provider  valACYclovir (VALTREX) 1000 MG tablet Take 1 tablet (1,000 mg total) by mouth 2 (two) times daily. 05/11/21  Yes Particia Nearing, PA-C  hydrOXYzine (ATARAX/VISTARIL) 25 MG tablet Take 1 tablet (25 mg total) by mouth every 6 (six) hours as needed for anxiety. 04/07/21   Wieters, Hallie C, PA-C  ondansetron (ZOFRAN ODT) 4 MG disintegrating tablet Take 1 tablet (4 mg total) by mouth every 8 (eight) hours as needed for nausea or vomiting. 04/07/21   Wieters, Junius Creamer, PA-C    Family History Family History  Problem  Relation Age of Onset   Diabetes Father    Hypertension Father    Asthma Mother    Hypertension Maternal Aunt    Cancer Maternal Aunt    Hypertension Maternal Uncle    Diabetes Paternal Aunt    Diabetes Paternal Uncle    Hypertension Maternal Grandmother    Cancer Maternal Grandmother    Hypertension Paternal Grandmother    Heart attack Neg Hx    Hyperlipidemia Neg Hx    Sudden death Neg Hx     Social History Social History   Tobacco Use   Smoking status: Never   Smokeless tobacco: Never  Substance Use Topics   Alcohol use: No    Alcohol/week: 0.0 standard drinks   Drug use: No     Allergies   Latex   Review of Systems Review of Systems Per HPI  Physical Exam Triage Vital Signs ED Triage Vitals  Enc Vitals Group     BP 05/11/21 1044 100/66     Pulse Rate 05/11/21 1044 80     Resp 05/11/21 1044 18     Temp 05/11/21 1044 98.2 F (36.8 C)     Temp Source 05/11/21 1044 Oral     SpO2 05/11/21 1044 98 %     Weight --      Height --      Head Circumference --      Peak Flow --  Pain Score 05/11/21 1042 0     Pain Loc --      Pain Edu? --      Excl. in GC? --    No data found.  Updated Vital Signs BP 100/66 (BP Location: Left Arm)   Pulse 80   Temp 98.2 F (36.8 C) (Oral)   Resp 18   SpO2 98%   Visual Acuity Right Eye Distance:   Left Eye Distance:   Bilateral Distance:    Right Eye Near:   Left Eye Near:    Bilateral Near:     Physical Exam Vitals and nursing note reviewed. Exam conducted with a chaperone present.  Constitutional:      Appearance: Normal appearance. She is not ill-appearing.  HENT:     Head: Atraumatic.  Eyes:     Extraocular Movements: Extraocular movements intact.     Conjunctiva/sclera: Conjunctivae normal.  Cardiovascular:     Rate and Rhythm: Normal rate and regular rhythm.     Heart sounds: Normal heart sounds.  Pulmonary:     Effort: Pulmonary effort is normal.     Breath sounds: Normal breath sounds.   Abdominal:     General: Bowel sounds are normal. There is no distension.     Palpations: Abdomen is soft.     Tenderness: There is no abdominal tenderness. There is no right CVA tenderness, left CVA tenderness or guarding.  Genitourinary:    Comments: Multiple small erythematous ulcerations present at base of vaginal introitus.  No drainage, bleeding, abnormal discharge present Musculoskeletal:        General: Normal range of motion.     Cervical back: Normal range of motion and neck supple.  Skin:    General: Skin is warm and dry.  Neurological:     Mental Status: She is alert and oriented to person, place, and time.  Psychiatric:        Mood and Affect: Mood normal.        Thought Content: Thought content normal.        Judgment: Judgment normal.     UC Treatments / Results  Labs (all labs ordered are listed, but only abnormal results are displayed) Labs Reviewed - No data to display  EKG   Radiology No results found.  Procedures Procedures (including critical care time)  Medications Ordered in UC Medications - No data to display  Initial Impression / Assessment and Plan / UC Course  I have reviewed the triage vital signs and the nursing notes.  Pertinent labs & imaging results that were available during my care of the patient were reviewed by me and considered in my medical decision making (see chart for details).     Consistent presentation and symptomatology with general herpes outbreak.  We will treat with a course of Valtrex and put refills in case future outbreaks.  Discussed triggers, contagious nature of issue.  She declines STD testing today given low suspicion for new infections.  Follow-up if symptoms worsening or not resolving.  Final Clinical Impressions(s) / UC Diagnoses   Final diagnoses:  Herpes simplex vulvovaginitis   Discharge Instructions   None    ED Prescriptions     Medication Sig Dispense Auth. Provider   valACYclovir (VALTREX)  1000 MG tablet Take 1 tablet (1,000 mg total) by mouth 2 (two) times daily. 14 tablet Particia Nearing, [place]      PDMP not reviewed this encounter.   Particia Nearing, [place] 05/11/21 1106

## 2021-05-12 ENCOUNTER — Encounter: Payer: Self-pay | Admitting: Family

## 2021-06-07 ENCOUNTER — Other Ambulatory Visit (HOSPITAL_COMMUNITY): Payer: Self-pay

## 2021-07-14 DIAGNOSIS — Z124 Encounter for screening for malignant neoplasm of cervix: Secondary | ICD-10-CM | POA: Diagnosis not present

## 2021-07-14 DIAGNOSIS — Z01419 Encounter for gynecological examination (general) (routine) without abnormal findings: Secondary | ICD-10-CM | POA: Diagnosis not present

## 2021-07-14 DIAGNOSIS — Z3202 Encounter for pregnancy test, result negative: Secondary | ICD-10-CM | POA: Diagnosis not present

## 2021-07-14 DIAGNOSIS — Z113 Encounter for screening for infections with a predominantly sexual mode of transmission: Secondary | ICD-10-CM | POA: Diagnosis not present

## 2021-10-17 ENCOUNTER — Other Ambulatory Visit (HOSPITAL_COMMUNITY): Payer: Self-pay

## 2021-11-17 ENCOUNTER — Encounter (HOSPITAL_BASED_OUTPATIENT_CLINIC_OR_DEPARTMENT_OTHER): Payer: Self-pay | Admitting: Obstetrics and Gynecology

## 2021-11-17 ENCOUNTER — Emergency Department (HOSPITAL_BASED_OUTPATIENT_CLINIC_OR_DEPARTMENT_OTHER)
Admission: EM | Admit: 2021-11-17 | Discharge: 2021-11-17 | Disposition: A | Discharge status: Home / Self Care | Payer: BC Managed Care – PPO | Attending: Emergency Medicine | Admitting: Emergency Medicine

## 2021-11-17 ENCOUNTER — Other Ambulatory Visit: Payer: Self-pay

## 2021-11-17 DIAGNOSIS — Z20822 Contact with and (suspected) exposure to covid-19: Secondary | ICD-10-CM | POA: Insufficient documentation

## 2021-11-17 DIAGNOSIS — J020 Streptococcal pharyngitis: Secondary | ICD-10-CM | POA: Insufficient documentation

## 2021-11-17 DIAGNOSIS — J029 Acute pharyngitis, unspecified: Secondary | ICD-10-CM | POA: Diagnosis present

## 2021-11-17 LAB — RESP PANEL BY RT-PCR (FLU A&B, COVID) ARPGX2
Influenza A by PCR: NEGATIVE
Influenza B by PCR: NEGATIVE
SARS Coronavirus 2 by RT PCR: NEGATIVE

## 2021-11-17 LAB — GROUP A STREP BY PCR: Group A Strep by PCR: DETECTED — AB

## 2021-11-17 LAB — PREGNANCY, URINE: Preg Test, Ur: NEGATIVE

## 2021-11-17 MED ORDER — AMOXICILLIN 500 MG PO CAPS: 500.0000 mg | ORAL_CAPSULE | Freq: Two times a day (BID) | ORAL | 0 refills | Status: DC

## 2021-11-17 MED ORDER — AMOXICILLIN 500 MG PO CAPS
500.0000 mg | ORAL_CAPSULE | Freq: Once | ORAL | Status: AC
  Administered 2021-11-17: 500 mg via ORAL
  Filled 2021-11-17: qty 1

## 2021-11-17 MED ORDER — IBUPROFEN 800 MG PO TABS
800.0000 mg | ORAL_TABLET | Freq: Once | ORAL | Status: AC
  Administered 2021-11-17: 800 mg via ORAL
  Filled 2021-11-17: qty 1

## 2021-11-17 NOTE — ED Notes (Signed)
RN provided AVS using Teachback Method. Patient verbalizes understanding of Discharge Instructions. Opportunity for Questioning and Answers were provided by RN. Patient Discharged from ED ambulatory to Home via Self.  

## 2021-11-17 NOTE — ED Provider Notes (Signed)
She is ?[facility] ?Provider Note ? ? ?CSN: [id number] ?Arrival date & time: 11/17/21  [id number] ? ?  ? ?History ? ?Chief Complaint  ?Patient presents with  ? Sore Throat  ? ? ?Tamara Rodriguez is a 26 y.o. female. ? ?HPI ? ?26 year old female presents emergency department with sore throat.  Patient states a week of congestion with 1 day of sore throat and painful swallowing.  Denies any fever but endorses chills.  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  She has no difficulty swallowing, denies any mouth/throat swallowing.  No cough, vomiting/diarrhea. ? ?Home Medications ?Prior to Admission medications   ?Medication Sig Start Date End Date Taking? Authorizing Provider  ?hydrOXYzine (ATARAX/VISTARIL) 25 MG tablet Take 1 tablet (25 mg total) by mouth every 6 (six) hours as needed for anxiety. 04/07/21   Wieters, Hallie C, PA-C  ?ondansetron (ZOFRAN ODT) 4 MG disintegrating tablet Take 1 tablet (4 mg total) by mouth every 8 (eight) hours as needed for nausea or vomiting. 04/07/21   Wieters, Hallie C, PA-C  ?valACYclovir (VALTREX) 1000 MG tablet Take 1 tablet (1,000 mg total) by mouth 2 (two) times daily. 05/11/21   Volney American, PA-C  ?   ? ?Allergies    ?Latex   ? ?Review of Systems   ?Review of Systems  ?Constitutional:  Positive for chills. Negative for fever.  ?HENT:  Positive for congestion and sore throat. Negative for trouble swallowing.   ?Respiratory:  Negative for shortness of breath.   ?Cardiovascular:  Negative for chest pain.  ?Gastrointestinal:  Negative for abdominal pain, diarrhea and vomiting.  ?Skin:  Negative for rash.  ?Neurological:  Negative for headaches.  ? ?Physical Exam ?Updated Vital Signs ?BP 103/74 (BP Location: Right Arm)   Pulse (!) 103   Temp 98 ?F (36.7 ?C)   Resp 16   LMP 10/06/2021 Comment: No longer on Depo  SpO2 100%  ?Physical Exam ?Vitals and nursing note reviewed.  ?Constitutional:   ?   General: She is not in acute  distress. ?   Appearance: Normal appearance.  ?HENT:  ?   Head: Normocephalic.  ?   Mouth/Throat:  ?   Mouth: Mucous membranes are moist.  ?   Pharynx: Uvula midline. Posterior oropharyngeal erythema present. No pharyngeal swelling, oropharyngeal exudate or uvula swelling.  ?Cardiovascular:  ?   Rate and Rhythm: Normal rate.  ?Pulmonary:  ?   Effort: Pulmonary effort is normal. No respiratory distress.  ?Abdominal:  ?   Palpations: Abdomen is soft.  ?Skin: ?   General: Skin is warm.  ?Neurological:  ?   Mental Status: She is alert and oriented to person, place, and time. Mental status is at baseline.  ?Psychiatric:     ?   Mood and Affect: Mood normal.  ? ? ?ED Results / Procedures / Treatments   ?Labs ?(all labs ordered are listed, but only abnormal results are displayed) ?Labs Reviewed  ?GROUP A STREP BY PCR - Abnormal; Notable for the following components:  ?    Result Value  ? Group A Strep by PCR DETECTED (*)   ? All other components within normal limits  ?RESP PANEL BY RT-PCR (FLU A&B, COVID) [id number]  ?PREGNANCY, URINE  ? ? ?EKG ?None ? ?Radiology ?No results found. ? ?Procedures ?Procedures  ? ? ?Medications Ordered in ED ?Medications - No data to display ? ?ED Course/ Medical Decision Making/ A&P ?  ?                        ?  Medical Decision Making ?Amount and/or Complexity of Data Reviewed ?Labs: ordered. ? ? ?26 year old female presents emergency department with generalized illness and sore throat.  Was tachycardic in triage, normal heart rate on my initial evaluation.  Erythematous throat without exudates, no mouth/neck swelling or difficulty swallowing.  Flu and COVID is negative, strep test is positive.  We will treat appropriately and plan for outpatient follow-up.  Patient at this time appears safe and stable for discharge and close outpatient follow up. Discharge plan and strict return to ED precautions discussed, patient verbalizes understanding and agreement. ? ? ? ? ? ? ? ?Final Clinical  Impression(s) / ED Diagnoses ?Final diagnoses:  ?None  ? ? ?Rx / DC Orders ?ED Discharge Orders   ? ? None  ? ?  ? ? ?  ?Lorelle Gibbs, DO ?11/17/21 1104 ? ?

## 2021-11-17 NOTE — Discharge Instructions (Signed)
Take antibiotic as directed.  Take Tylenol/ibuprofen as needed for pain control.  Stay well-hydrated. ?

## 2021-11-17 NOTE — ED Triage Notes (Signed)
Patient reports x1 week of congestion, sore throat and difficulty swallowing. 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

## 2022-02-05 ENCOUNTER — Other Ambulatory Visit: Payer: Self-pay

## 2022-02-05 ENCOUNTER — Encounter (HOSPITAL_BASED_OUTPATIENT_CLINIC_OR_DEPARTMENT_OTHER): Payer: Self-pay | Admitting: Obstetrics and Gynecology

## 2022-02-05 ENCOUNTER — Emergency Department (HOSPITAL_BASED_OUTPATIENT_CLINIC_OR_DEPARTMENT_OTHER): Payer: BC Managed Care – PPO

## 2022-02-05 ENCOUNTER — Emergency Department (HOSPITAL_BASED_OUTPATIENT_CLINIC_OR_DEPARTMENT_OTHER)
Admission: EM | Admit: 2022-02-05 | Discharge: 2022-02-05 | Disposition: A | Discharge status: Home / Self Care | Payer: BC Managed Care – PPO | Attending: Emergency Medicine | Admitting: Emergency Medicine

## 2022-02-05 DIAGNOSIS — R109 Unspecified abdominal pain: Secondary | ICD-10-CM | POA: Diagnosis present

## 2022-02-05 DIAGNOSIS — R630 Anorexia: Secondary | ICD-10-CM | POA: Diagnosis not present

## 2022-02-05 DIAGNOSIS — K5900 Constipation, unspecified: Secondary | ICD-10-CM | POA: Insufficient documentation

## 2022-02-05 DIAGNOSIS — R110 Nausea: Secondary | ICD-10-CM | POA: Diagnosis not present

## 2022-02-05 DIAGNOSIS — Z91040 Latex allergy status: Secondary | ICD-10-CM | POA: Insufficient documentation

## 2022-02-05 LAB — COMPREHENSIVE METABOLIC PANEL
ALT: 6 U/L (ref 0–44)
AST: 14 U/L — ABNORMAL LOW (ref 15–41)
Albumin: 4.2 g/dL (ref 3.5–5.0)
Alkaline Phosphatase: 77 U/L (ref 38–126)
Anion gap: 9 (ref 5–15)
BUN: 12 mg/dL (ref 6–20)
CO2: 26 mmol/L (ref 22–32)
Calcium: 9.7 mg/dL (ref 8.9–10.3)
Chloride: 104 mmol/L (ref 98–111)
Creatinine, Ser: 0.94 mg/dL (ref 0.44–1.00)
GFR, Estimated: >60 mL/min (ref >60)
Glucose, Bld: 82 mg/dL (ref 70–99)
Potassium: 3.9 mmol/L (ref 3.5–5.1)
Sodium: 139 mmol/L (ref 135–145)
Total Bilirubin: 1.1 mg/dL (ref 0.3–1.2)
Total Protein: 7.4 g/dL (ref 6.5–8.1)

## 2022-02-05 LAB — CBC
HCT: 40.5 % (ref 36.0–46.0)
Hemoglobin: 12.8 g/dL (ref 12.0–15.0)
MCH: 27 pg (ref 26.0–34.0)
MCHC: 31.6 g/dL (ref 30.0–36.0)
MCV: 85.4 fL (ref 80.0–100.0)
Platelets: 231 10*3/uL (ref 150–400)
RBC: 4.74 MIL/uL (ref 3.87–5.11)
RDW: 15.3 % (ref 11.5–15.5)
WBC: 4.5 10*3/uL (ref 4.0–10.5)
nRBC: 0 % (ref 0.0–0.2)

## 2022-02-05 LAB — LIPASE, BLOOD: Lipase: 31 U/L (ref 11–51)

## 2022-02-05 LAB — URINALYSIS, ROUTINE W REFLEX MICROSCOPIC
Bilirubin Urine: NEGATIVE
Glucose, UA: NEGATIVE mg/dL
Hgb urine dipstick: NEGATIVE
Ketones, ur: NEGATIVE mg/dL
Leukocytes,Ua: NEGATIVE
Nitrite: NEGATIVE
Specific Gravity, Urine: 1.026 (ref 1.005–1.030)
pH: 7.5 (ref 5.0–8.0)

## 2022-02-05 LAB — HCG, SERUM, QUALITATIVE: Preg, Serum: NEGATIVE

## 2022-02-05 MED ORDER — ONDANSETRON 4 MG PO TBDP
4.0000 mg | ORAL_TABLET | Freq: Once | ORAL | Status: AC
  Administered 2022-02-05: 4 mg via ORAL
  Filled 2022-02-05: qty 1

## 2022-02-05 MED ORDER — POLYETHYLENE GLYCOL 3350 17 G PO PACK: 17.0000 g | PACK | Freq: Every day | ORAL | 0 refills | Status: AC

## 2022-02-05 MED ORDER — IOHEXOL 300 MG/ML  SOLN
100.0000 mL | Freq: Once | INTRAMUSCULAR | Status: AC | PRN
  Administered 2022-02-05: 80 mL via INTRAVENOUS

## 2022-02-05 NOTE — Discharge Instructions (Addendum)
Follow-up with your primary care within the next 2 to 3 days for reevaluation and continued medical management.  If you do not have a primary care, a list of resources been provided below.  2 prescriptions have been sent to your pharmacy, they are as follows: Zofran-an antinausea medication.  This is a dissolvable tablet meant to provide nausea relief.  You may take 1 tablet every 8 hours as needed. MiraLAX-a laxative.  You may take this to help relieve constipation.  If the constipation becomes chronic, you have been provided the contact information for a local GI office.  Please call and schedule an appointment to follow-up for further work-up as needed.  Return to the ED for new or worsening symptoms as discussed.   If you do not have a doctor see the list below.  RESOURCE GUIDE  Chronic Pain Problems: Contact [facility]  [phone] Patients need to be referred by their primary care doctor.  Insufficient Money for Medicine: Contact United Way:  call "211" or [facility] [phone].  No Primary Care Doctor: Call [facility]  [phone] - can help you locate a primary care doctor that  accepts your insurance, provides certain services, etc. Physician Referral Service- [phone] Agencies that provide inexpensive medical care: [facility]  [phone] [facility]  [phone] [facility]  [phone] [facility]  [phone] [facility]  [phone] [facility]  [phone]  Medicaid-accepting [facility] Providers: [facility]- [address]  [phone], Mon-Fri 9am-7pm, Sat 9am-1pm [facility]- [address] [place], Suite [place]  [phone] [facility]- [address], Suite [place]  [phone] [facility]- [address]  [phone] Renaye Rakers- [address] [place], [address], [phone]  Only accepts  [place] patients after they have their name  applied to their card  Self Pay (no insurance) in [facility]: Sickle Cell Patients: Dr Willey Blade, [facility]  [address] [place], [phone] [facility]- [address] [place]  [phone]       [facility]- [address]       -     [facility]- see information above (Speak to [employer] if you do not have insurance)       -  [facility]- [address] [place], [phone]       -  [facility]- [address],  [phone]       -  [facility]- [address] [phone]       -  Dr Julio Sicks-  [address], [place], [phone]       -  [facility]- [address] [phone]       -  [facility]- [address], [phone], also [address] [phone]       -    [facility]- [address] [place], [phone], 1st & 3rd Saturday   every month, 10am-1pm  1) Find a Doctor and Pay Out of Pocket Although you won't have to find out who is covered by your insurance plan, it is a good idea to ask around and get recommendations. You will then need to call the office and see if the doctor you have  chosen will accept you as a new patient and what types of options they offer for patients who are self-pay. Some doctors offer discounts or will set up payment plans for their patients who do not have insurance, but you will need to ask so you aren't surprised when you get to your appointment.  2) Contact Your Local Health Department Not all health departments have doctors that can see patients for sick visits, but many do, so it is worth a call to see if yours does. If you don't know where your local health department is, you can check in your phone book. The CDC also has a tool to help you locate your state's health department, and many state websites also have listings of all of their local  health departments.  3) Find a Walk-in Clinic If your illness is not likely to be very severe or complicated, you may want to try a walk in clinic. These are popping up all over the country in pharmacies, drugstores, and shopping centers. They're usually staffed by nurse practitioners or physician assistants that have been trained to treat common illnesses and complaints. They're usually fairly quick and inexpensive. However, if you have serious medical issues or chronic medical problems, these are probably not your best option  STD Testing [facility] Department of [facility] [place], STD Clinic, [address], [place], phone [phone] or [phone].  Monday - Friday, call for an appointment. [facility] Department of [employer], STD Clinic, [place] [address], [place], phone [phone] or [phone].  Monday - Friday, call for an appointment.  Abuse/Neglect: [facility] Child Abuse Hotline 610 884 4350 [facility] Child Abuse Hotline 501-556-3241 (After Hours)  Emergency Shelter:  [facility] 502-257-2260  Maternity Homes: Room at the [place] [facility] 859 674 4586 [facility] [phone]  MRSA Hotline #:   234-370-0242  [facility] Resources  [facility] [place]     United Way                          [facility] Dept. [address] [place]                       [address]      [address]  Blondell Reveal Phone:  [phone]                                   Phone:  [phone]                 Phone:  [phone]  [facility], [phone] [phone]       -     [facility] in [place], [address],  [phone],  [place] [phone] or [phone] (After Hours)  [place]  Substance Abuse Resources: Alcohol and Drug Services  [place] (847)484-2302 [facility] [place] Chinita Pester 6782158147 Residential & Outpatient Substance Abuse Program  ([phone]  Psychological Services: [place]  [phone] [place]  [place], [place] [address] [place], [place]: [phone] or [phone], [employer]  Dental Assistance  Patients with Medicaid: [place] 773-860-2560 [address] Phone:  [phone]                                                  Phone:  [phone]  If unable to pay or uninsured, contact:  [facility] or [facility]. to become qualified for the adult dental clinic.  Patients with Medicaid: [facility] [phone] [address], [place] [address], [phone]  If unable to pay, or uninsured, contact [facility] [phone]) or [place] [phone] in [place], [place] in [facility]) to become qualified for the adult dental clinic  Other [place]- [place], [place], [place], [address], [place], [place], 2nd and 4th Thursday of the month at 6:30am.  10 clients each day by appointment, can sometimes see walk-in patients if someone does not show for an appointment. [facility]- [address] [place]ard Danker [place], [place], [address], [place], [place], [place], [address], [place] Department- [phone] [place] [facility] [phone]

## 2022-02-05 NOTE — ED Triage Notes (Signed)
Patient reports to the ER for nausea, cramps, headaches, gags when brushing her teeth, fatigue, and feels gassy. Reports negative pregnancy test but states she has not had a period since march

## 2022-02-05 NOTE — ED Provider Notes (Signed)
[facility] Provider [facility]   CSN: [id number] Arrival date & time: 02/05/22  1115     History {Add pertinent medical, surgical, social history, OB history to HPI:1} Chief Complaint  Patient presents with   Nausea    Tamara Rodriguez is a 26 y.o. female with chief complaint of nausea, abdominal cramps, decreased appetite, and 1-2 episodes of diarrhea.  Symptoms began around May 26.  LMP March 3.  Hx of irregular menstrual cycles, it is not uncommon to miss 1 to 3 months at a time.  Not actively on any birth control.  Endorses pregnancy as a possibility.  Denies vomiting, constipation, fevers, chills, urinary symptoms, shortness of breath, chest pain, vision changes, headaches, back pain, or flank pain.  Denies vaginal bleeding or discharge.  Has not seen OB/GYN since fall of last year.  No recent upper respiratory infection, trauma, or sick contacts.  Hx of anxiety, depression, irregular menses.  The history is provided by the patient and medical records.       Home Medications Prior to Admission medications   Medication Sig Start Date End Date Taking? Authorizing Provider  polyethylene glycol (MIRALAX / GLYCOLAX) 17 g packet Take 17 g by mouth daily. 02/05/22  Yes Cecil Cobbsockerham, Macon Sandiford M, PA-C  amoxicillin (AMOXIL) 500 MG capsule Take 1 capsule (500 mg total) by mouth 2 (two) times daily. 11/17/21   Horton, Clabe SealKristie M, DO  hydrOXYzine (ATARAX/VISTARIL) 25 MG tablet Take 1 tablet (25 mg total) by mouth every 6 (six) hours as needed for anxiety. 04/07/21   Wieters, Hallie C, PA-C  ondansetron (ZOFRAN ODT) 4 MG disintegrating tablet Take 1 tablet (4 mg total) by mouth every 8 (eight) hours as needed for nausea or vomiting. 04/07/21   Wieters, Hallie C, PA-C  valACYclovir (VALTREX) 1000 MG tablet Take 1 tablet (1,000 mg total) by mouth 2 (two) times daily. 05/11/21   Particia NearingLane, Rachel Elizabeth, PA-C      Allergies    Latex    Review of Systems   Review of Systems   Constitutional:  Positive for appetite change.  Gastrointestinal:  Positive for abdominal pain (Cramping), diarrhea and nausea.    Physical Exam Updated Vital Signs BP 109/61   Pulse 67   Temp 99.8 F (37.7 C) (Oral)   Resp 16   LMP 11/17/2021 (Approximate)   SpO2 100%  Physical Exam Vitals and nursing note reviewed.  Constitutional:      General: She is not in acute distress.    Appearance: Normal appearance. She is well-developed. She is not ill-appearing, toxic-appearing or diaphoretic.  HENT:     Head: Normocephalic and atraumatic.     Nose: Nose normal.     Mouth/Throat:     Mouth: Mucous membranes are moist.     Pharynx: Oropharynx is clear.  Eyes:     Conjunctiva/sclera: Conjunctivae normal.  Neck:     Comments: Neck very supple Cardiovascular:     Rate and Rhythm: Normal rate and regular rhythm.     Pulses: Normal pulses.     Heart sounds: No murmur heard. Pulmonary:     Effort: Pulmonary effort is normal. No respiratory distress.     Breath sounds: Normal breath sounds.  Chest:     Chest wall: No tenderness.  Abdominal:     General: Bowel sounds are normal. There is no distension.     Palpations: Abdomen is soft. There is no mass.     Tenderness: There is abdominal tenderness. There is  no right CVA tenderness, left CVA tenderness, guarding or rebound.  Musculoskeletal:        General: No swelling.     Cervical back: Neck supple. No rigidity.  Skin:    General: Skin is warm and dry.     Capillary Refill: Capillary refill takes less than 2 seconds.     Coloration: Skin is not jaundiced or pale.  Neurological:     Mental Status: She is alert and oriented to person, place, and time.  Psychiatric:        Mood and Affect: Mood normal.     ED Results / Procedures / Treatments   Labs (all labs ordered are listed, but only abnormal results are displayed) Labs Reviewed  COMPREHENSIVE METABOLIC PANEL - Abnormal; Notable for the following components:       Result Value   AST 14 (*)    All other components within normal limits  URINALYSIS, ROUTINE W REFLEX MICROSCOPIC - Abnormal; Notable for the following components:   Protein, ur TRACE (*)    All other components within normal limits  LIPASE, BLOOD  CBC  HCG, SERUM, QUALITATIVE    EKG None  Radiology CT ABDOMEN PELVIS W CONTRAST  Result Date: 02/05/2022 CLINICAL DATA:  Nausea and vomiting. Right lower quadrant abdominal pain EXAM: CT ABDOMEN AND PELVIS WITH CONTRAST TECHNIQUE: Multidetector CT imaging of the abdomen and pelvis was performed using the standard protocol following bolus administration of intravenous contrast. RADIATION DOSE REDUCTION: This exam was performed according to the departmental dose-optimization program which includes automated exposure control, adjustment of the mA and/or kV according to patient size and/or use of iterative reconstruction technique. CONTRAST:  44mL OMNIPAQUE IOHEXOL 300 MG/ML  SOLN COMPARISON:  None FINDINGS: Lower chest: No acute abnormality. Hepatobiliary: Several small subcentimeter low-density foci are identified within the liver which are technically too small to reliably characterize. No suspicious liver lesion identified. Gallbladder appears normal. No bile duct dilatation. Pancreas: Unremarkable. No pancreatic ductal dilatation or surrounding inflammatory changes. Spleen: Normal adrenal glands. No kidney mass, nephrolithiasis, or hydronephrosis. Urinary bladder appears normal. Adrenals/Urinary Tract: Adrenal glands are unremarkable. Kidneys are normal. No renal calculi, focal lesion or hydronephrosis. Unremarkable appearance of the urinary bladder. Stomach/Bowel: The stomach appears normal. The appendix is visualized and appears normal. No small bowel wall thickening, inflammation or distension. There is a moderate volume of retained stool within the mid sigmoid colon, image 45/5. Distal to this segment is a short segment of focal luminal narrowing  with mucosal enhancement and mild wall edema, image 40/5. This is followed by a mildly distended rectum containing a moderate amount of desiccated stool with mucosal enhancement and mild perirectal haziness. Vascular/Lymphatic: No significant vascular findings are present. No enlarged abdominal or pelvic lymph nodes. Reproductive: The uterus and adnexal structures have a normal appearance. No adnexal mass noted. Other: Trace fluid noted within the pelvis. This is a nonspecific finding in a premenopausal female. No signs of pneumoperitoneum. Musculoskeletal: No acute or significant osseous findings. IMPRESSION: 1. There is a moderate volume of retained stool within the mid sigmoid colon. Distal to this segment is a short segment of focal luminal narrowing with mucosal enhancement and mild wall edema. This is followed by a mildly distended rectum containing a moderate amount of desiccated stool with mucosal enhancement and mild perirectal haziness. Correlate for any clinical signs of distal colitis or stercoral colitis. 2. No signs of bowel obstruction, perforation or abscess. 3. Normal appendix. Electronically Signed   By: Signa Kell  M.D.   On: 02/05/2022 14:02    Procedures Procedures  {Document cardiac monitor, telemetry assessment procedure when appropriate:1}  Medications Ordered in ED Medications  ondansetron (ZOFRAN-ODT) disintegrating tablet 4 mg (4 mg Oral Given 02/05/22 1215)  iohexol (OMNIPAQUE) 300 MG/ML solution 100 mL (80 mLs Intravenous Contrast Given 02/05/22 1335)    ED Course/ Medical Decision Making/ A&P                           Medical Decision Making Amount and/or Complexity of Data Reviewed External Data Reviewed: notes. Labs: ordered. Decision-making details documented in ED Course. Radiology: ordered and independent interpretation performed. Decision-making details documented in ED Course. ECG/medicine tests: ordered and independent interpretation performed.  Decision-making details documented in ED Course.  Risk OTC drugs. Prescription drug management.   26 y.o. female presents to the ED for concern of Nausea   This involves an extensive number of treatment options, and is a complaint that carries with it a high risk of complications and morbidity.  The emergent differential diagnosis prior to evaluation includes, but is not limited to: Appendicitis, constipation, renal calculi, pyelonephritis, acute cystitis, UTI, PID, ovarian torsion, ectopic pregnancy, intrauterine pregnancy, irregular menses, endometriosis, anxiety  This is not an exhaustive differential.   Past Medical History / Co-morbidities / Social History: Hx of anxiety, depression, irregular menses, HSV vulvovaginitis.  Additional History:  Internal and external records from outside source obtained and reviewed including ED visits, urgent care  Physical Exam: Physical exam performed. The pertinent findings include: Mild diffuse abdominal tenderness, with slight emphasis over the right lower quadrant.  Afebrile.  Lab Tests: I ordered, and personally interpreted labs.  The pertinent results include:   CBC: Unremarkable CMP/BMP: Unremarkable hCG quantitative: Negative UA: Unremarkable  Imaging Studies: I ordered imaging studies including CT abdomen and pelvis.  I independently visualized and interpreted said imaging.  Pertinent results include:  I agree with the radiologist interpretation.  Medications: I have reviewed the patients home medicines and have made adjustments as needed  ED Course/Disposition: Pt well-appearing on exam.  ***Patient is nontoxic, nonseptic appearing, in no apparent distress.  Patient's pain and other symptoms adequately managed in emergency department.  Zofran given.  Upon reevaluation, patient has improvement of nausea after the Zofran, but abdominal pain still remains.  UA unremarkable, without urinary symptoms, and without suprapubic or flank  tenderness.  Low suspicion for UTI, acute cystitis, pyelonephritis, or AKI.  Patient is afebrile, in no acute distress, with nonsurgical abdomen on exam.  Low suspicion for ovarian torsion, PID, TOA.  Low suspicion for diverticulitis, bowel perforation, or bowel obstruction.  Without RUQ tenderness, epigastric tenderness with or without radiation to the back, or abnormal hepatobiliary labs, low suspicion for cholecystitis or pancreatitis.  RLQ tenderness suspicious for appendicitis or constipation.  Ordered CT abdomen and pelvis to further evaluate.  Plan to reassess.  1430: Imaging suggestive of moderate stool burden.  No indication of bowel obstruction or perforation, diverticulitis, or appendicitis.  Labs, imaging and vitals reviewed.  Patient does not meet the SIRS or Sepsis criteria.  On repeat exam patient does not have a surgical abdomen and there are no peritoneal signs.  Vitals stable, patient afebrile.  Recommend trial of MiraLAX and conservative management, with close PCP follow-up.  Patient in NAD and good condition at time of discharge.  After consideration of the diagnostic results and the patient's encounter today, I feel that the emergency department workup does not suggest  an emergent condition requiring admission or immediate intervention beyond what has been performed at this time.  The patient is safe for discharge and has been instructed to return immediately for worsening symptoms, change in symptoms or any other concerns.  Discussed course of treatment thoroughly with the patient, whom demonstrated understanding.  Patient in agreement and has no further questions.  I discussed this case with my attending physician Dr. Stevie Kern, who agreed with the proposed treatment course and cosigned this note including patient's presenting symptoms, physical exam, and planned diagnostics and interventions.  Attending physician stated agreement with plan or made changes to plan which were implemented.      This chart was dictated using voice recognition software.  Despite best efforts to proofread, errors can occur which can change the documentation meaning.   {Document critical care time when appropriate:1} {Document review of labs and clinical decision tools ie heart score, Chads2Vasc2 etc:1}  {Document your independent review of radiology images, and any outside records:1} {Document your discussion with family members, caretakers, and with consultants:1} {Document social determinants of health affecting pt's care:1} {Document your decision making why or why not admission, treatments were needed:1} Final Clinical Impression(s) / ED Diagnoses Final diagnoses:  Constipation, unspecified constipation type    Rx / DC Orders ED Discharge Orders          Ordered    polyethylene glycol (MIRALAX / GLYCOLAX) 17 g packet  Daily        02/05/22 1448

## 2022-03-28 ENCOUNTER — Emergency Department (HOSPITAL_COMMUNITY)
Admission: EM | Admit: 2022-03-28 | Discharge: 2022-03-29 | Disposition: A | Discharge status: Home / Self Care | Payer: BC Managed Care – PPO | Attending: Emergency Medicine | Admitting: Emergency Medicine

## 2022-03-28 ENCOUNTER — Ambulatory Visit (INDEPENDENT_AMBULATORY_CARE_PROVIDER_SITE_OTHER)
Admission: EM | Admit: 2022-03-28 | Discharge: 2022-03-28 | Disposition: A | Discharge status: Home / Self Care | Payer: BC Managed Care – PPO | Source: Home / Self Care

## 2022-03-28 ENCOUNTER — Other Ambulatory Visit: Payer: Self-pay

## 2022-03-28 ENCOUNTER — Encounter (HOSPITAL_COMMUNITY): Payer: Self-pay

## 2022-03-28 ENCOUNTER — Encounter: Payer: Self-pay | Admitting: Urgent Care

## 2022-03-28 ENCOUNTER — Emergency Department (HOSPITAL_COMMUNITY): Payer: BC Managed Care – PPO

## 2022-03-28 DIAGNOSIS — R110 Nausea: Secondary | ICD-10-CM | POA: Insufficient documentation

## 2022-03-28 DIAGNOSIS — R1084 Generalized abdominal pain: Secondary | ICD-10-CM

## 2022-03-28 DIAGNOSIS — R350 Frequency of micturition: Secondary | ICD-10-CM

## 2022-03-28 DIAGNOSIS — Z91040 Latex allergy status: Secondary | ICD-10-CM | POA: Insufficient documentation

## 2022-03-28 DIAGNOSIS — R109 Unspecified abdominal pain: Secondary | ICD-10-CM

## 2022-03-28 DIAGNOSIS — R1011 Right upper quadrant pain: Secondary | ICD-10-CM | POA: Insufficient documentation

## 2022-03-28 DIAGNOSIS — M545 Low back pain, unspecified: Secondary | ICD-10-CM | POA: Insufficient documentation

## 2022-03-28 DIAGNOSIS — N3001 Acute cystitis with hematuria: Secondary | ICD-10-CM | POA: Insufficient documentation

## 2022-03-28 DIAGNOSIS — R509 Fever, unspecified: Secondary | ICD-10-CM | POA: Insufficient documentation

## 2022-03-28 DIAGNOSIS — K529 Noninfective gastroenteritis and colitis, unspecified: Secondary | ICD-10-CM | POA: Insufficient documentation

## 2022-03-28 DIAGNOSIS — E860 Dehydration: Secondary | ICD-10-CM

## 2022-03-28 DIAGNOSIS — R197 Diarrhea, unspecified: Secondary | ICD-10-CM

## 2022-03-28 LAB — CBC WITH DIFFERENTIAL/PLATELET
Abs Immature Granulocytes: 0.06 10*3/uL (ref 0.00–0.07)
Basophils Absolute: 0 10*3/uL (ref 0.0–0.1)
Basophils Relative: 0 %
Eosinophils Absolute: 0 10*3/uL (ref 0.0–0.5)
Eosinophils Relative: 0 %
HCT: 38 % (ref 36.0–46.0)
Hemoglobin: 12.1 g/dL (ref 12.0–15.0)
Immature Granulocytes: 1 %
Lymphocytes Relative: 19 %
Lymphs Abs: 2.4 10*3/uL (ref 0.7–4.0)
MCH: 28.3 pg (ref 26.0–34.0)
MCHC: 31.8 g/dL (ref 30.0–36.0)
MCV: 88.8 fL (ref 80.0–100.0)
Monocytes Absolute: 0.8 10*3/uL (ref 0.1–1.0)
Monocytes Relative: 6 %
Neutro Abs: 9.8 10*3/uL — ABNORMAL HIGH (ref 1.7–7.7)
Neutrophils Relative %: 74 %
Platelets: 243 10*3/uL (ref 150–400)
RBC: 4.28 MIL/uL (ref 3.87–5.11)
RDW: 15.5 % (ref 11.5–15.5)
WBC: 13.1 10*3/uL — ABNORMAL HIGH (ref 4.0–10.5)
nRBC: 0 % (ref 0.0–0.2)

## 2022-03-28 LAB — COMPREHENSIVE METABOLIC PANEL
ALT: 9 U/L (ref 0–44)
AST: 16 U/L (ref 15–41)
Albumin: 3.4 g/dL — ABNORMAL LOW (ref 3.5–5.0)
Alkaline Phosphatase: 60 U/L (ref 38–126)
Anion gap: 4 — ABNORMAL LOW (ref 5–15)
BUN: 10 mg/dL (ref 6–20)
CO2: 23 mmol/L (ref 22–32)
Calcium: 8.7 mg/dL — ABNORMAL LOW (ref 8.9–10.3)
Chloride: 109 mmol/L (ref 98–111)
Creatinine, Ser: 0.95 mg/dL (ref 0.44–1.00)
GFR, Estimated: >60 mL/min (ref >60)
Glucose, Bld: 99 mg/dL (ref 70–99)
Potassium: 3.4 mmol/L — ABNORMAL LOW (ref 3.5–5.1)
Sodium: 136 mmol/L (ref 135–145)
Total Bilirubin: 2.5 mg/dL — ABNORMAL HIGH (ref 0.3–1.2)
Total Protein: 6.8 g/dL (ref 6.5–8.1)

## 2022-03-28 LAB — URINALYSIS, ROUTINE W REFLEX MICROSCOPIC
Bilirubin Urine: NEGATIVE
Glucose, UA: NEGATIVE mg/dL
Hgb urine dipstick: NEGATIVE
Ketones, ur: 20 mg/dL — AB
Leukocytes,Ua: NEGATIVE
Nitrite: NEGATIVE
Protein, ur: NEGATIVE mg/dL
Specific Gravity, Urine: 1.026 (ref 1.005–1.030)
pH: 5 (ref 5.0–8.0)

## 2022-03-28 LAB — POCT URINALYSIS DIP (MANUAL ENTRY)
Blood, UA: NEGATIVE
Glucose, UA: NEGATIVE mg/dL
Nitrite, UA: POSITIVE — AB
Protein Ur, POC: 30 mg/dL — AB
Spec Grav, UA: >=1.03 — AB (ref 1.010–1.025)
Urobilinogen, UA: 1 E.U./dL
pH, UA: 5.5 (ref 5.0–8.0)

## 2022-03-28 LAB — LIPASE, BLOOD: Lipase: 29 U/L (ref 11–51)

## 2022-03-28 LAB — POCT URINE PREGNANCY: Preg Test, Ur: NEGATIVE

## 2022-03-28 MED ORDER — ONDANSETRON 8 MG PO TBDP: 8.0000 mg | ORAL_TABLET | Freq: Three times a day (TID) | ORAL | 0 refills | Status: AC | PRN

## 2022-03-28 MED ORDER — SODIUM CHLORIDE 0.9 % IV SOLN
1.0000 g | Freq: Once | INTRAVENOUS | Status: AC
  Administered 2022-03-28: 1 g via INTRAVENOUS
  Filled 2022-03-28: qty 10

## 2022-03-28 MED ORDER — LOPERAMIDE HCL 2 MG PO CAPS: 2.0000 mg | ORAL_CAPSULE | Freq: Two times a day (BID) | ORAL | 0 refills | Status: AC | PRN

## 2022-03-28 MED ORDER — CIPROFLOXACIN HCL 500 MG PO TABS: 500.0000 mg | ORAL_TABLET | Freq: Two times a day (BID) | ORAL | 0 refills | Status: AC

## 2022-03-28 MED ORDER — SODIUM CHLORIDE 0.9 % IV BOLUS
1000.0000 mL | Freq: Once | INTRAVENOUS | Status: AC
  Administered 2022-03-28: 1000 mL via INTRAVENOUS

## 2022-03-28 MED ORDER — IOHEXOL 300 MG/ML  SOLN
100.0000 mL | Freq: Once | INTRAMUSCULAR | Status: AC | PRN
  Administered 2022-03-28: 100 mL via INTRAVENOUS

## 2022-03-28 MED ORDER — SODIUM CHLORIDE (PF) 0.9 % IJ SOLN
INTRAMUSCULAR | Status: AC
  Filled 2022-03-28: qty 50

## 2022-03-28 MED ORDER — FENTANYL CITRATE PF 50 MCG/ML IJ SOSY
50.0000 ug | PREFILLED_SYRINGE | Freq: Once | INTRAMUSCULAR | Status: AC
  Administered 2022-03-28: 50 ug via INTRAVENOUS
  Filled 2022-03-28: qty 1

## 2022-03-28 NOTE — Discharge Instructions (Signed)
Please start ciprofloxacin to address an urinary tract infection. Make sure you hydrate very well with plain water and a quantity of 64 ounces of water a day.  Please limit drinks that are considered urinary irritants such as soda, sweet tea, coffee, energy drinks, alcohol.  These can worsen your urinary and genital symptoms but also be the source of them.  I will let you know about your urine culture results through MyChart to see if we need to prescribe or change your antibiotics based off of those results.  For your stomach, try to eat light meals including soups, broths and soft foods, fruits.  You may use Zofran for your nausea and vomiting once every 8 hours.  Imodium can help with diarrhea but use this carefully limiting it to 1-2 times per day only if you are having a lot of diarrhea.  Please return to the clinic if symptoms worsen or you start having severe abdominal pain not helped by taking Tylenol or start having bloody stools or blood in the vomit.

## 2022-03-28 NOTE — ED Provider Triage Note (Signed)
Emergency Medicine Provider Triage Evaluation Note  Tamara Rodriguez , a 26 y.o. female  was evaluated in triage.  Pt complains of right-sided and suprapubic abdominal pain onset 3 AM this morning.  Patient was evaluated at urgent care where she was diagnosed with a UTI and prescribed ciprofloxacin as well as Zofran.  Has associated nausea and emesis.  Denies chest pain, shortness of breath.  Denies abdominal surgeries.   Per patient chart review: Patient was evaluated urgent care today and diagnosed with a UTI and started on ciprofloxacin.  Review of Systems  Positive: As per HPI Negative:   Physical Exam  BP 103/61 (BP Location: Right Arm)   Pulse 100   Temp 98.4 F (36.9 C) (Oral)   Resp 18   Ht 5' (1.524 m)   Wt 48.5 kg   SpO2 100%   BMI 20.90 kg/m  Gen:   Awake, no distress  Resp:  Normal effort  MSK:   Moves extremities without difficulty  Other:  Tenderness to palpation noted to right sided abdomen and suprapubic region.  Positive Murphy sign.  Medical Decision Making  Medically screening exam initiated at 8:19 PM.  Appropriate orders placed.  [place] was informed that the remainder of the evaluation will be completed by another provider, this initial triage assessment does not replace that evaluation, and the importance of remaining in the ED until their evaluation is complete.  Work-up initiated   Vonette Grosso A, PA-C 03/28/22 2026

## 2022-03-28 NOTE — ED Triage Notes (Signed)
Pt here with with sharp generalized abdominal pain and diarrhea since last night after having hibachi. Pt also states she has some back pain and urinary frequency.

## 2022-03-28 NOTE — ED Provider Notes (Signed)
[facility]   MRN: [id number] DOB: June 20, 1996  Subjective:   Tamara Rodriguez is a 26 y.o. female presenting for several day history of acute onset urinary frequency, urinary urgency, bilateral low back pain.  She has also had multiple bouts of diarrhea since last night, generalized belly pain after she ate at a restaurant.  No fever, flank tenderness, dysuria, hematuria, vaginal discharge.  No concern for STI.  No history of GI disorders.  No recent antibiotic use.  No bloody stools, nausea, vomiting, hematemesis.  No cough, chest pain, shortness of breath, body pains.  Does not want a COVID test.  No current facility-administered medications for this encounter.  Current Outpatient Medications:    hydrOXYzine (ATARAX/VISTARIL) 25 MG tablet, Take 1 tablet (25 mg total) by mouth every 6 (six) hours as needed for anxiety., Disp: 12 tablet, Rfl: 0   ondansetron (ZOFRAN ODT) 4 MG disintegrating tablet, Take 1 tablet (4 mg total) by mouth every 8 (eight) hours as needed for nausea or vomiting., Disp: 20 tablet, Rfl: 0   polyethylene glycol (MIRALAX / GLYCOLAX) 17 g packet, Take 17 g by mouth daily., Disp: 14 each, Rfl: 0   valACYclovir (VALTREX) 1000 MG tablet, Take 1 tablet (1,000 mg total) by mouth 2 (two) times daily., Disp: 14 tablet, Rfl: 1   Allergies  Allergen Reactions   Latex Hives    Past Medical History:  Diagnosis Date   Anxiety    Depression      History reviewed. No pertinent surgical history.  Family History  Problem Relation Age of Onset   Diabetes Father    Hypertension Father    Asthma Mother    Hypertension Maternal Aunt    Cancer Maternal Aunt    Hypertension Maternal Uncle    Diabetes Paternal Aunt    Diabetes Paternal Uncle    Hypertension Maternal Grandmother    Cancer Maternal Grandmother    Hypertension Paternal Grandmother    Heart attack Neg Hx    Hyperlipidemia Neg Hx    Sudden death Neg Hx     Social History   Tobacco  Use   Smoking status: Never   Smokeless tobacco: Never  Vaping Use   Vaping Use: Never used  Substance Use Topics   Alcohol use: Yes   Drug use: No    ROS   Objective:   Vitals: BP 102/71 (BP Location: Right Arm)   Pulse (!) 112   Temp 98.3 F (36.8 C) (Oral)   Resp 20   SpO2 99%   Physical Exam Constitutional:      General: She is not in acute distress.    Appearance: Normal appearance. She is well-developed. She is not ill-appearing, toxic-appearing or diaphoretic.  HENT:     Head: Normocephalic and atraumatic.     Nose: Nose normal.     Mouth/Throat:     Mouth: Mucous membranes are moist.     Pharynx: Oropharynx is clear.  Eyes:     General: No scleral icterus.       Right eye: No discharge.        Left eye: No discharge.     Extraocular Movements: Extraocular movements intact.     Conjunctiva/sclera: Conjunctivae normal.  Cardiovascular:     Rate and Rhythm: Normal rate and regular rhythm.     Heart sounds: Normal heart sounds. No murmur heard.    No friction rub. No gallop.  Pulmonary:     Effort: Pulmonary effort is  normal. No respiratory distress.     Breath sounds: No stridor. No wheezing, rhonchi or rales.  Chest:     Chest wall: No tenderness.  Abdominal:     General: Bowel sounds are increased. There is no distension.     Palpations: Abdomen is soft. There is no mass.     Tenderness: There is generalized abdominal tenderness (throughout). There is no right CVA tenderness, left CVA tenderness, guarding or rebound.  Skin:    General: Skin is warm and dry.  Neurological:     General: No focal deficit present.     Mental Status: She is alert and oriented to person, place, and time.  Psychiatric:        Mood and Affect: Mood normal.        Behavior: Behavior normal.        Thought Content: Thought content normal.        Judgment: Judgment normal.     Results for orders placed or performed during the hospital encounter of 03/28/22 (from the past  24 hour(s))  POCT urinalysis dipstick     Status: Abnormal   Collection Time: 03/28/22  8:36 AM  Result Value Ref Range   Color, UA orange (A) yellow   Clarity, UA cloudy (A) clear   Glucose, UA negative negative mg/dL   Bilirubin, UA moderate (A) negative   Ketones, POC UA large (80) (A) negative mg/dL   Spec Grav, UA >=8.101 (A) 1.010 - 1.025   Blood, UA negative negative   pH, UA 5.5 5.0 - 8.0   Protein Ur, POC =30 (A) negative mg/dL   Urobilinogen, UA 1.0 0.2 or 1.0 E.U./dL   Nitrite, UA Positive (A) Negative   Leukocytes, UA Small (1+) (A) Negative  POCT urine pregnancy     Status: None   Collection Time: 03/28/22  8:36 AM  Result Value Ref Range   Preg Test, Ur Negative Negative   IV bolus of 1000 cc normal saline given over 55 minutes.  Assessment and Plan :   PDMP not reviewed this encounter.  1. Acute cystitis with hematuria   2. Dehydration   3. Generalized abdominal pain   4. Diarrhea, unspecified type   5. Urinary frequency   6. Acute bilateral low back pain without sciatica    Start ciprofloxacin to cover for acute cystitis, urine culture pending.  Recommended aggressive hydration, limiting urinary irritants. Will also manage for suspected colitis/gastroenteritis with supportive care.  Recommended patient hydrate well, eat light meals and maintain electrolytes.  Will use Zofran and Imodium for nausea, vomiting and diarrhea. Counseled patient on potential for adverse effects with medications prescribed/recommended today, ER and return-to-clinic precautions discussed, patient verbalized understanding.    Wallis Bamberg, [place] 03/28/22 [phone]

## 2022-03-28 NOTE — ED Triage Notes (Signed)
Patient coming to ED for evaluation of abdominal pain.  States pain started around 0300 this morning.  Was seen at Urgent Care and dx with a UTI.  Given antibiotic.  Pain now radiates into R flank.

## 2022-03-28 NOTE — ED Provider Notes (Signed)
[facility]-EMERGENCY DEPT Provider Note   CSN: [id number] Arrival date & time: 03/28/22  1959     History {Add pertinent medical, surgical, social history, OB history to HPI:1} Chief Complaint  Patient presents with   Abdominal Pain    Tamara Rodriguez is a 26 y.o. female.  She has no significant past medical history.  She is complaining of some right-sided abdominal and flank pain that started around 3 AM this morning.  Associated with nausea.  She has felt hot and cold but has not checked her temperature.  Had diarrhea yesterday nothing today.  No urinary symptoms.  No cough or shortness of breath.  Went to urgent care where they told her she had a UTI and treated her with antibiotics and nausea medication.  She does not feel any better.  The history is provided by the patient.  Abdominal Pain Pain location:  RUQ and R flank Pain quality: aching   Pain severity:  Severe Onset quality:  Gradual Duration:  1 day Timing:  Constant Progression:  Unchanged Chronicity:  New Context: not trauma   Relieved by:  Nothing Worsened by:  Nothing Ineffective treatments:  None tried Associated symptoms: chills, fever and nausea   Associated symptoms: no chest pain, no cough, no diarrhea, no dysuria, no shortness of breath, no sore throat and no vomiting        Home Medications Prior to Admission medications   Medication Sig Start Date End Date Taking? Authorizing Provider  ciprofloxacin (CIPRO) 500 MG tablet Take 1 tablet (500 mg total) by mouth 2 (two) times daily. 03/28/22   Wallis Bamberg, PA-C  hydrOXYzine (ATARAX/VISTARIL) 25 MG tablet Take 1 tablet (25 mg total) by mouth every 6 (six) hours as needed for anxiety. 04/07/21   Wieters, Hallie C, PA-C  loperamide (IMODIUM) 2 MG capsule Take 1 capsule (2 mg total) by mouth 2 (two) times daily as needed for diarrhea or loose stools. 03/28/22   Wallis Bamberg, PA-C  ondansetron (ZOFRAN-ODT) 8 MG disintegrating tablet Take 1 tablet  (8 mg total) by mouth every 8 (eight) hours as needed for nausea or vomiting. 03/28/22   Wallis Bamberg, PA-C  polyethylene glycol (MIRALAX / GLYCOLAX) 17 g packet Take 17 g by mouth daily. 02/05/22   Cecil Cobbs, PA-C  valACYclovir (VALTREX) 1000 MG tablet Take 1 tablet (1,000 mg total) by mouth 2 (two) times daily. 05/11/21   Particia Nearing, PA-C      Allergies    Latex    Review of Systems   Review of Systems  Constitutional:  Positive for chills and fever.  HENT:  Negative for sore throat.   Respiratory:  Negative for cough and shortness of breath.   Cardiovascular:  Negative for chest pain.  Gastrointestinal:  Positive for abdominal pain and nausea. Negative for diarrhea and vomiting.  Genitourinary:  Negative for dysuria.  Musculoskeletal:  Positive for back pain.  Skin:  Negative for rash.    Physical Exam Updated Vital Signs BP (!) 86/72 (BP Location: Left Arm)   Pulse 97   Temp 98.2 F (36.8 C) (Oral)   Resp 16   Ht 5' (1.524 m)   Wt 48.5 kg   SpO2 100%   BMI 20.90 kg/m  Physical Exam Vitals and nursing note reviewed.  Constitutional:      General: She is not in acute distress.    Appearance: Normal appearance. She is well-developed.  HENT:     Head: Normocephalic and atraumatic.  Eyes:     Conjunctiva/sclera: Conjunctivae normal.  Cardiovascular:     Rate and Rhythm: Normal rate and regular rhythm.     Heart sounds: No murmur heard. Pulmonary:     Effort: Pulmonary effort is normal. No respiratory distress.     Breath sounds: Normal breath sounds.  Abdominal:     Palpations: Abdomen is soft.     Tenderness: There is no abdominal tenderness. There is no guarding or rebound.  Musculoskeletal:        General: Normal range of motion.     Cervical back: Neck supple.     Right lower leg: No edema.     Left lower leg: No edema.  Skin:    General: Skin is warm and dry.     Capillary Refill: Capillary refill takes less than 2 seconds.  Neurological:      General: No focal deficit present.     Mental Status: She is alert.     ED Results / Procedures / Treatments   Labs (all labs ordered are listed, but only abnormal results are displayed) Labs Reviewed  CBC WITH DIFFERENTIAL/PLATELET - Abnormal; Notable for the following components:      Result Value   WBC 13.1 (*)    Neutro Abs 9.8 (*)    All other components within normal limits  COMPREHENSIVE METABOLIC PANEL - Abnormal; Notable for the following components:   Potassium 3.4 (*)    Calcium 8.7 (*)    Albumin 3.4 (*)    Total Bilirubin 2.5 (*)    Anion gap 4 (*)    All other components within normal limits  LIPASE, BLOOD  URINALYSIS, ROUTINE W REFLEX MICROSCOPIC    EKG None  Radiology No results found.  Procedures Procedures  {Document cardiac monitor, telemetry assessment procedure when appropriate:1}  Medications Ordered in ED Medications  sodium chloride 0.9 % bolus 1,000 mL (has no administration in time range)  fentaNYL (SUBLIMAZE) injection 50 mcg (has no administration in time range)  cefTRIAXone (ROCEPHIN) 1 g in sodium chloride 0.9 % 100 mL IVPB (has no administration in time range)    ED Course/ Medical Decision Making/ A&P                           Medical Decision Making Amount and/or Complexity of Data Reviewed Radiology: ordered.  Risk Prescription drug management.   This patient complains of ***; this involves an extensive number of treatment Options and is a complaint that carries with it a high risk of complications and morbidity. The differential includes ***  I ordered, reviewed and interpreted labs, which included *** I ordered medication *** and reviewed PMP when indicated. I ordered imaging studies which included *** and I independently    visualized and interpreted imaging which showed *** Additional history obtained from *** Previous records obtained and reviewed *** I consulted *** and discussed lab and imaging findings and  discussed disposition.  Cardiac monitoring reviewed, *** Social determinants considered, *** Critical Interventions: ***  After the interventions stated above, I reevaluated the patient and found *** Admission and further testing considered, ***   {Document critical care time when appropriate:1} {Document review of labs and clinical decision tools ie heart score, Chads2Vasc2 etc:1}  {Document your independent review of radiology images, and any outside records:1} {Document your discussion with family members, caretakers, and with consultants:1} {Document social determinants of health affecting pt's care:1} {Document your decision making why or why not  admission, treatments were needed:1} Final Clinical Impression(s) / ED Diagnoses Final diagnoses:  None    Rx / DC Orders ED Discharge Orders     None

## 2022-03-29 LAB — URINE CULTURE: Culture: NO GROWTH

## 2022-03-29 NOTE — Discharge Instructions (Addendum)
You are seen in the emergency department for continued abdominal pain.  Your lab work showed your infection cell count to be slightly elevated but there was no evidence of urine infection.  Your CAT scan did not show any acute abnormalities.  Please start with a clear liquid diet and stay well-hydrated.  Follow-up with your regular doctor.  Return to the emergency department if any worsening or concerning symptoms

## 2022-04-09 ENCOUNTER — Other Ambulatory Visit: Payer: Self-pay

## 2022-04-09 ENCOUNTER — Encounter (HOSPITAL_BASED_OUTPATIENT_CLINIC_OR_DEPARTMENT_OTHER): Payer: Self-pay

## 2022-04-09 ENCOUNTER — Emergency Department (HOSPITAL_BASED_OUTPATIENT_CLINIC_OR_DEPARTMENT_OTHER)
Admission: EM | Admit: 2022-04-09 | Discharge: 2022-04-09 | Disposition: A | Discharge status: Home / Self Care | Payer: BC Managed Care – PPO | Attending: Emergency Medicine | Admitting: Emergency Medicine

## 2022-04-09 DIAGNOSIS — B9789 Other viral agents as the cause of diseases classified elsewhere: Secondary | ICD-10-CM | POA: Diagnosis not present

## 2022-04-09 DIAGNOSIS — Z20822 Contact with and (suspected) exposure to covid-19: Secondary | ICD-10-CM | POA: Diagnosis not present

## 2022-04-09 DIAGNOSIS — R051 Acute cough: Secondary | ICD-10-CM

## 2022-04-09 DIAGNOSIS — Z91040 Latex allergy status: Secondary | ICD-10-CM | POA: Insufficient documentation

## 2022-04-09 DIAGNOSIS — J069 Acute upper respiratory infection, unspecified: Secondary | ICD-10-CM | POA: Diagnosis not present

## 2022-04-09 DIAGNOSIS — R059 Cough, unspecified: Secondary | ICD-10-CM | POA: Diagnosis present

## 2022-04-09 LAB — PREGNANCY, URINE: Preg Test, Ur: NEGATIVE

## 2022-04-09 LAB — RESP PANEL BY RT-PCR (FLU A&B, COVID) ARPGX2
Influenza A by PCR: NEGATIVE
Influenza B by PCR: NEGATIVE
SARS Coronavirus 2 by RT PCR: NEGATIVE

## 2022-04-09 MED ORDER — ACETAMINOPHEN 325 MG PO TABS
650.0000 mg | ORAL_TABLET | Freq: Once | ORAL | Status: AC | PRN
  Administered 2022-04-09: 650 mg via ORAL
  Filled 2022-04-09: qty 2

## 2022-04-09 MED ORDER — BENZONATATE 100 MG PO CAPS: 100.0000 mg | ORAL_CAPSULE | Freq: Three times a day (TID) | ORAL | 0 refills | Status: AC

## 2022-04-09 MED ORDER — ONDANSETRON HCL 4 MG PO TABS: 4.0000 mg | ORAL_TABLET | Freq: Three times a day (TID) | ORAL | 0 refills | Status: AC | PRN

## 2022-04-09 NOTE — ED Notes (Signed)
Pt stated that her BP is usually on the softer side of normal. In the 100 systolic.

## 2022-04-09 NOTE — ED Triage Notes (Signed)
Patient here POV from Home.  Endorses Cough for Approximately 4-5 Days. Mostly Dry.   Associated with Nausea, Headache, Fever, Chills.   No Sore Throat.   NAD Noted during Triage. A&Ox4. GCS 15. Ambulatory.

## 2022-04-09 NOTE — ED Provider Notes (Signed)
[facility] EMERGENCY DEPT Provider Note   CSN: [id number] Arrival date & time: 04/09/22  1151     History  Chief Complaint  Patient presents with   Cough    Tamara Rodriguez is a 26 y.o. female.  The history is provided by the patient and medical records. No language interpreter was used.  Cough Cough characteristics:  Non-productive Sputum characteristics:  Nondescript Severity:  Moderate Onset quality:  Gradual Duration:  4 days Timing:  Constant Progression:  Unchanged Chronicity:  New Context: sick contacts   Relieved by:  Nothing Worsened by:  Nothing Ineffective treatments:  None tried Associated symptoms: chills, fever, rhinorrhea and sinus congestion   Associated symptoms: no chest pain, no diaphoresis, no headaches, no rash, no shortness of breath and no wheezing        Home Medications Prior to Admission medications   Medication Sig Start Date End Date Taking? Authorizing Provider  ciprofloxacin (CIPRO) 500 MG tablet Take 1 tablet (500 mg total) by mouth 2 (two) times daily. 03/28/22   Wallis Bamberg, PA-C  hydrOXYzine (ATARAX/VISTARIL) 25 MG tablet Take 1 tablet (25 mg total) by mouth every 6 (six) hours as needed for anxiety. Patient not taking: Reported on 03/28/2022 04/07/21   Wieters, Fran Lowes C, PA-C  loperamide (IMODIUM) 2 MG capsule Take 1 capsule (2 mg total) by mouth 2 (two) times daily as needed for diarrhea or loose stools. 03/28/22   Wallis Bamberg, PA-C  ondansetron (ZOFRAN-ODT) 8 MG disintegrating tablet Take 1 tablet (8 mg total) by mouth every 8 (eight) hours as needed for nausea or vomiting. 03/28/22   Wallis Bamberg, PA-C  polyethylene glycol (MIRALAX / GLYCOLAX) 17 g packet Take 17 g by mouth daily. Patient not taking: Reported on 03/28/2022 02/05/22   Cecil Cobbs, PA-C  valACYclovir (VALTREX) 1000 MG tablet Take 1 tablet (1,000 mg total) by mouth 2 (two) times daily. Patient not taking: Reported on 03/28/2022 05/11/21   Particia Nearing,  PA-C      Allergies    Latex    Review of Systems   Review of Systems  Constitutional:  Positive for chills and fever. Negative for diaphoresis and fatigue.  HENT:  Positive for congestion and rhinorrhea.   Respiratory:  Positive for cough. Negative for chest tightness, shortness of breath and wheezing.   Cardiovascular:  Negative for chest pain, palpitations and leg swelling.  Gastrointestinal:  Positive for diarrhea, nausea and vomiting. Negative for abdominal pain and constipation.  Genitourinary:  Negative for dysuria.  Musculoskeletal:  Negative for back pain, neck pain and neck stiffness.  Skin:  Negative for rash and wound.  Neurological:  Negative for weakness, light-headedness, numbness and headaches.  Psychiatric/Behavioral:  Negative for agitation and confusion.   All other systems reviewed and are negative.   Physical Exam Updated Vital Signs BP 97/63 (BP Location: Right Arm)   Pulse (!) 112   Temp (!) 100.8 F (38.2 C)   Resp 12   Ht 5' (1.524 m)   Wt 48.5 kg   SpO2 100%   BMI 20.88 kg/m  Physical Exam Vitals and nursing note reviewed.  Constitutional:      General: She is not in acute distress.    Appearance: She is well-developed. She is not ill-appearing, toxic-appearing or diaphoretic.  HENT:     Head: Normocephalic and atraumatic.     Nose: Congestion present. No rhinorrhea.     Mouth/Throat:     Mouth: Mucous membranes are moist.  Pharynx: No oropharyngeal exudate or posterior oropharyngeal erythema.  Eyes:     Extraocular Movements: Extraocular movements intact.     Conjunctiva/sclera: Conjunctivae normal.     Pupils: Pupils are equal, round, and reactive to light.  Cardiovascular:     Rate and Rhythm: Normal rate and regular rhythm.     Heart sounds: No murmur heard. Pulmonary:     Effort: Pulmonary effort is normal. No respiratory distress.     Breath sounds: Normal breath sounds. No wheezing, rhonchi or rales.  Chest:     Chest wall: No  tenderness.  Abdominal:     General: Abdomen is flat.     Palpations: Abdomen is soft.     Tenderness: There is no abdominal tenderness. There is no right CVA tenderness, left CVA tenderness, guarding or rebound.  Musculoskeletal:        General: No swelling or tenderness.     Cervical back: Neck supple. No tenderness.     Right lower leg: No edema.     Left lower leg: No edema.  Skin:    General: Skin is warm and dry.     Capillary Refill: Capillary refill takes less than 2 seconds.     Findings: No erythema or rash.  Neurological:     General: No focal deficit present.     Mental Status: She is alert.     Sensory: No sensory deficit.     Motor: No weakness.  Psychiatric:        Mood and Affect: Mood normal.     ED Results / Procedures / Treatments   Labs (all labs ordered are listed, but only abnormal results are displayed) Labs Reviewed  RESP PANEL BY RT-PCR (FLU A&B, COVID) ARPGX2  PREGNANCY, URINE    EKG None  Radiology No results found.  Procedures Procedures    Medications Ordered in ED Medications  acetaminophen (TYLENOL) tablet 650 mg (650 mg Oral Given 04/09/22 1234)    ED Course/ Medical Decision Making/ A&P                           Medical Decision Making Amount and/or Complexity of Data Reviewed Labs: ordered.  Risk OTC drugs.    Tamara Rodriguez is a 26 y.o. female with a past medical history significant for anxiety and depression who presents with cough congestion fevers, chills.  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  She does not think anybody had any history of COVID or flu but she has had symptoms for the last 4 days.  She reports he has had intermittent fevers, chills, cough, congestion.  She denies any chest pain or shortness of breath.  Denies any urinary symptoms does report some nausea vomiting, diarrhea mildly.  She went to make sure she did not have COVID but otherwise denies  any complaints.  Denies any headache or neck pain.  Denies any syncope.  Denies other complaints right now.  On my exam, lungs were clear and chest was nontender.  Abdomen was nontender.  No focal neurologic deficits.  Patient resting comfortably.  On my evaluation heart rate has improved below 100 and temperature has also improved after she was given some Tylenol in triage.  Her lungs were clear and we had a shared decision conversation offering chest x-ray and labs however given her improving symptoms and negative COVID and flu test from triage, she agreed  she did not think needed further work-up.  She is not hypoxic whatsoever and thinks this is likely more of a viral infection.  Given her otherwise well appearance and improving symptoms, we will give her prescription for Tessalon and Zofran so she can begin hydration at home and she will take antipyretics at home.  She will follow-up with a PCP and understood return precautions.  We discussed that if symptoms worsen, she would likely need to come back for x-ray and labs and patient agrees.  She no other questions or concerns and patient was discharged in good condition with improving symptoms.           Final Clinical Impression(s) / ED Diagnoses Final diagnoses:  Acute cough  Viral upper respiratory tract infection    Rx / DC Orders ED Discharge Orders          Ordered    benzonatate (TESSALON) 100 MG capsule  Every 8 hours        04/09/22 1457    ondansetron (ZOFRAN) 4 MG tablet  Every 8 hours PRN        04/09/22 1457            Clinical Impression: 1. Acute cough   2. Viral upper respiratory tract infection     Disposition: Discharge  Condition: Good  I have discussed the results, Dx and Tx plan with the pt(& family if present). He/she/they expressed understanding and agree(s) with the plan. Discharge instructions discussed at great length. Strict return precautions discussed and pt &/or family have verbalized  understanding of the instructions. No further questions at time of discharge.    Discharge Medication List as of 04/09/2022  2:58 PM     START taking these medications   Details  benzonatate (TESSALON) 100 MG capsule Take 1 capsule (100 mg total) by mouth every 8 (eight) hours., Starting Sun 04/09/2022, Print    ondansetron (ZOFRAN) 4 MG tablet Take 1 tablet (4 mg total) by mouth every 8 (eight) hours as needed for nausea or vomiting., Starting Sun 04/09/2022, Print        Follow Up: [facility] [address] [place] [address] [place] [address] [phone] Schedule an appointment as soon as possible for a visit    [facility] [address] [place] [address] [phone]       Morris Markham, Canary Brim, MD 04/09/22 [phone]

## 2022-04-09 NOTE — ED Notes (Addendum)
Discharge paperwork given and verbally understood. Pt informed to take tylenol when ever she has a fever of over 100.4, make sure she drinks fluids and eats bland foods for the next few days. Pt understood.

## 2022-04-09 NOTE — Discharge Instructions (Signed)
Your history, exam, work-up today are consistent with a likely viral upper respiratory infection causing the fevers, chills, cough, and other symptoms.  Your COVID and flu test were negative and your exam did not reveal any evidence of acute pneumonia with no abnormal lung sounds.  We had a shared decision-making conversation offering labs and imaging however given your well appearance and normal oxygen saturations and improved vital signs, we do feel you are safe for discharge home.  With the nausea you had and labs and vomiting, please use the nausea medicine.  Please maintain hydration.  Please rest.  Please follow-up with your primary doctor.  Please use the cough medicine as needed.  If any symptoms change or worsen acutely, please return to the nearest emergency department.

## 2022-05-15 ENCOUNTER — Other Ambulatory Visit: Payer: Self-pay | Admitting: Family Medicine

## 2022-05-16 NOTE — Telephone Encounter (Signed)
Urgent Care Patient Requested Prescriptions  Pending Prescriptions Disp Refills  . valACYclovir (VALTREX) 1000 MG tablet 14 tablet 1    Sig: Take 1 tablet (1,000 mg total) by mouth 2 (two) times daily.     There is no refill protocol information for this order

## 2022-05-17 ENCOUNTER — Other Ambulatory Visit (HOSPITAL_COMMUNITY): Payer: Self-pay

## 2022-10-10 IMAGING — CT CT ABD-PELV W/ CM
2 of 6 series · 16 of 46 positions shown, 18 images · IV contrast (agent unspecified)
Comparison: None

CLINICAL DATA: Nausea and vomiting. Right lower quadrant abdominal
pain

EXAM:
CT ABDOMEN AND PELVIS WITH CONTRAST
TECHNIQUE: Multidetector CT imaging of the abdomen and pelvis was performed
using the standard protocol following bolus administration of
intravenous contrast.

[Series 2: abd pel w · axial · 0.62mm/px · z∈[+689,+1054]mm · 13 of 83 slices shown, 15 images]
[im 5/83  soft-tissue]
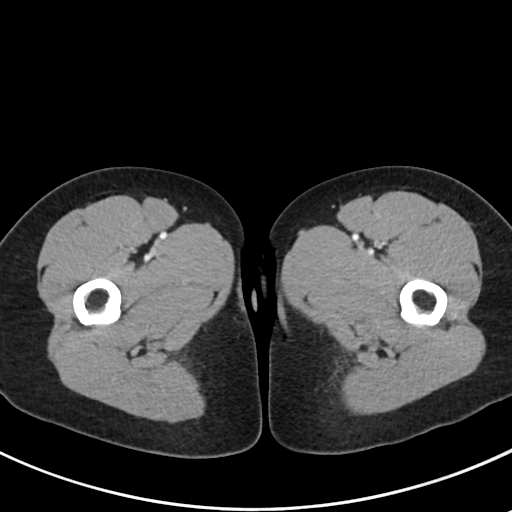
[im 5/83  bone]
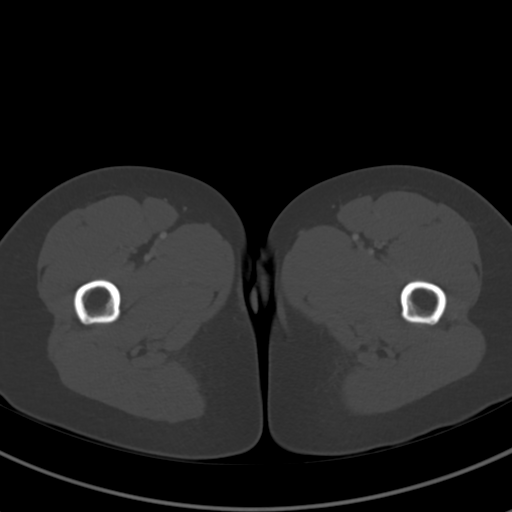
[im 13/83  soft-tissue]
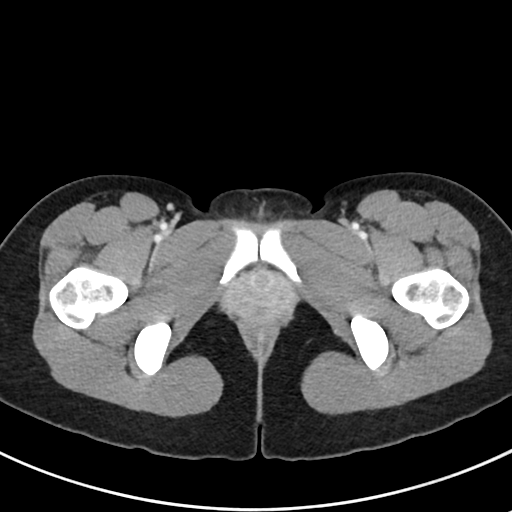
[im 18/83  soft-tissue]
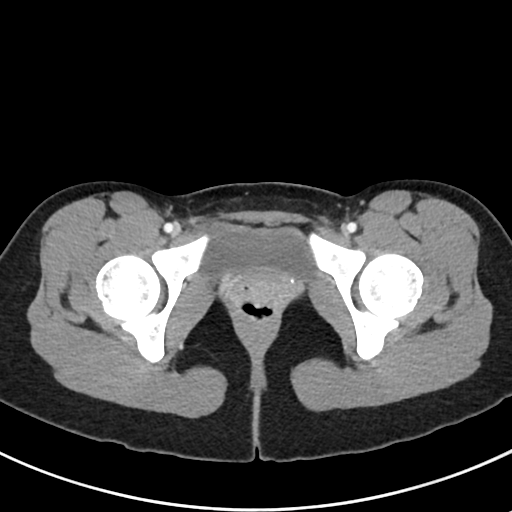
[im 22/83  soft-tissue]
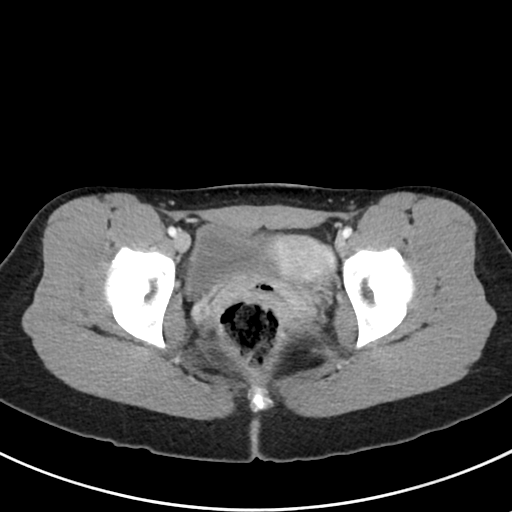
[im 31/83  soft-tissue]
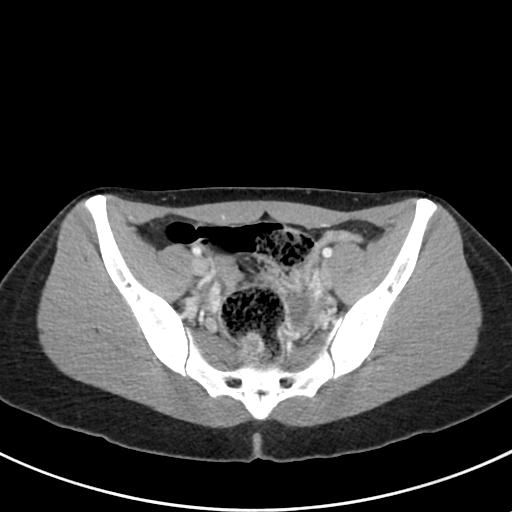
[im 35/83  soft-tissue]
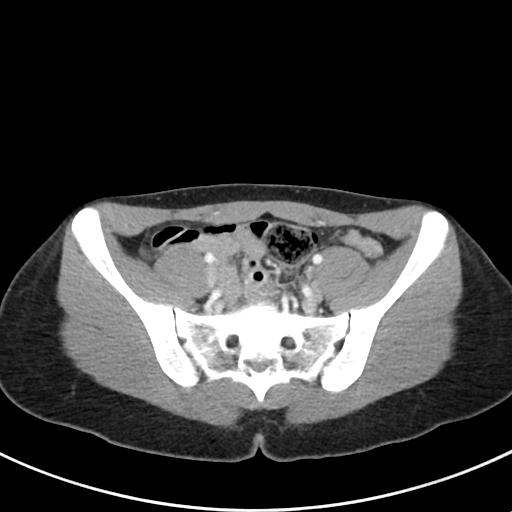
[im 44/83  soft-tissue]
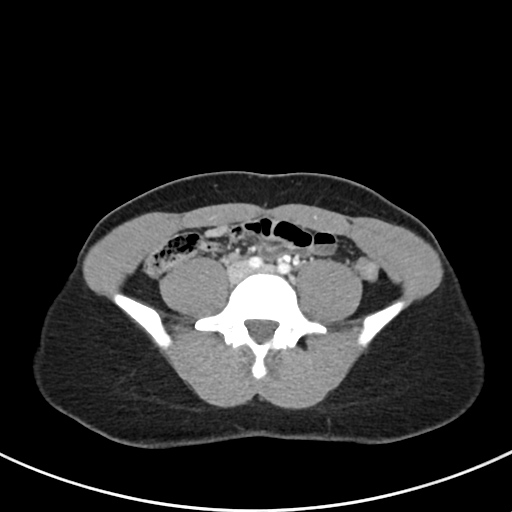
[im 48/83  soft-tissue]
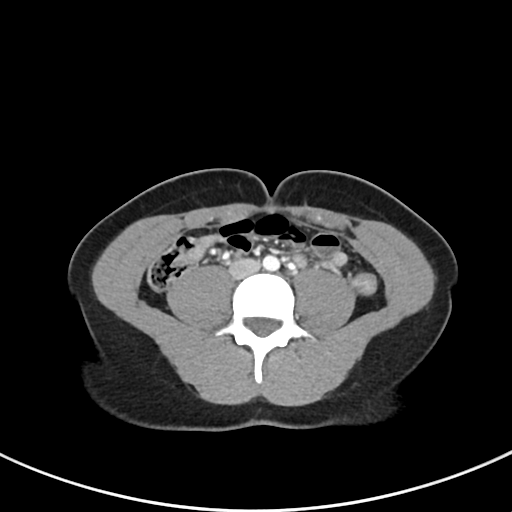
[im 52/83  soft-tissue]
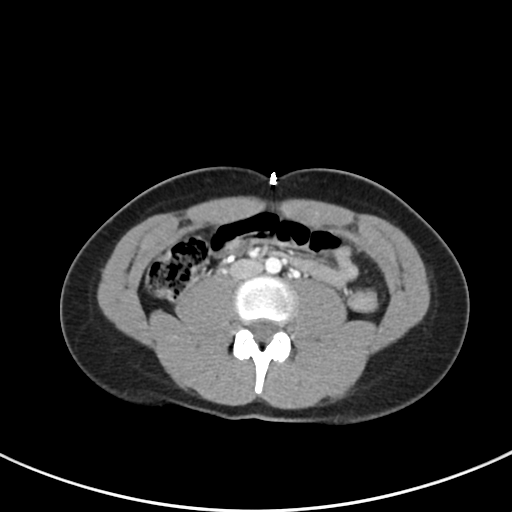
[im 52/83  bone]
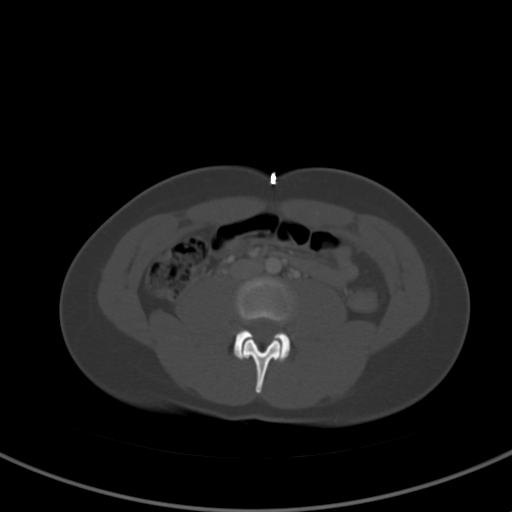
[im 61/83  soft-tissue]
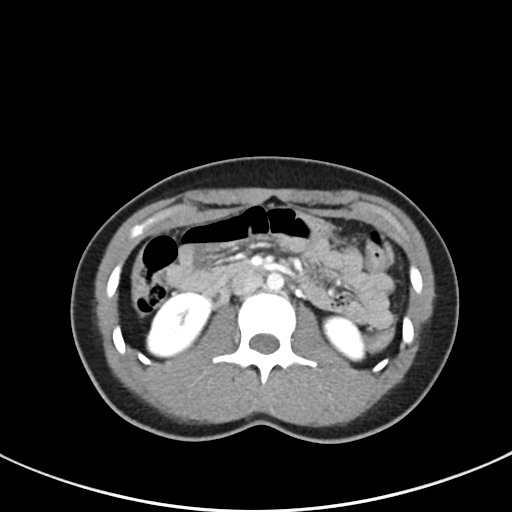
[im 65/83  soft-tissue]
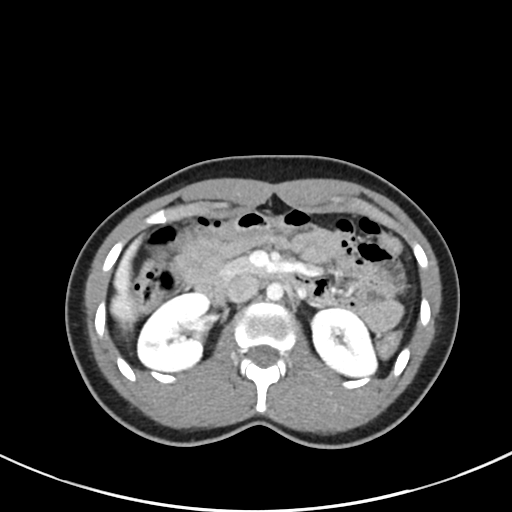
[im 70/83  soft-tissue]
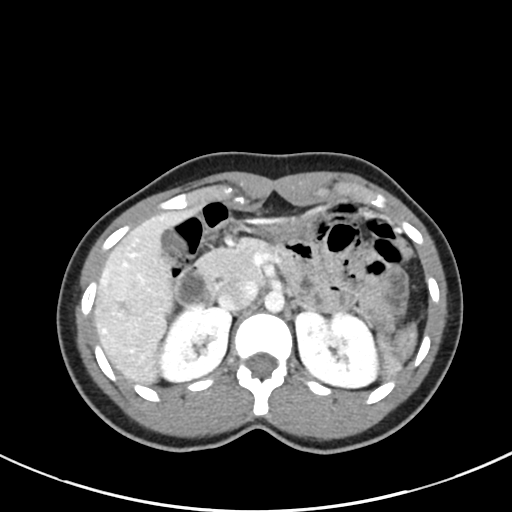
[im 78/83  soft-tissue]
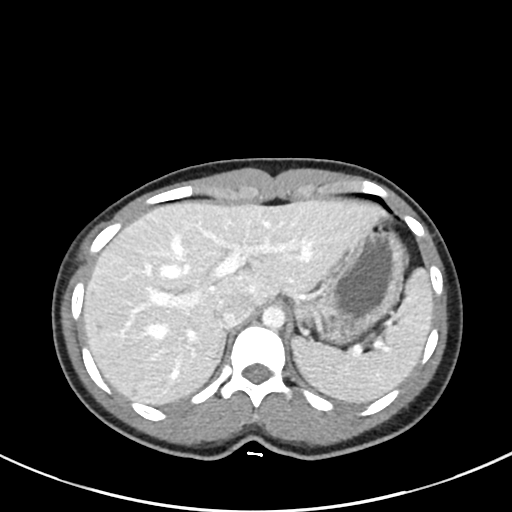

[Series 5: coronal · coronal · 0.73mm/px · 3 of 66 slices shown]
[im 22/66  soft-tissue]
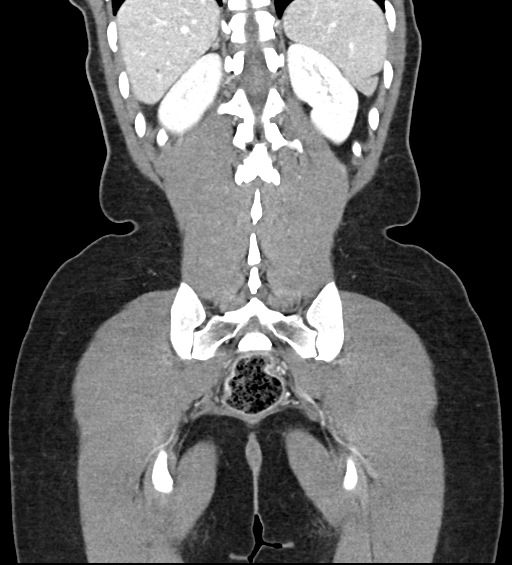
[im 29/66  soft-tissue]
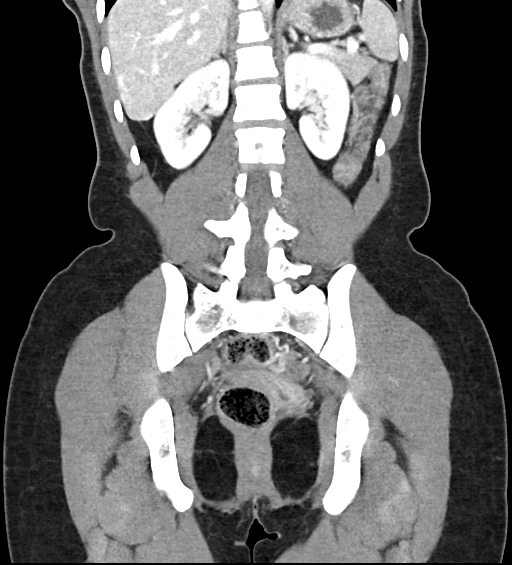
[im 37/66  soft-tissue]
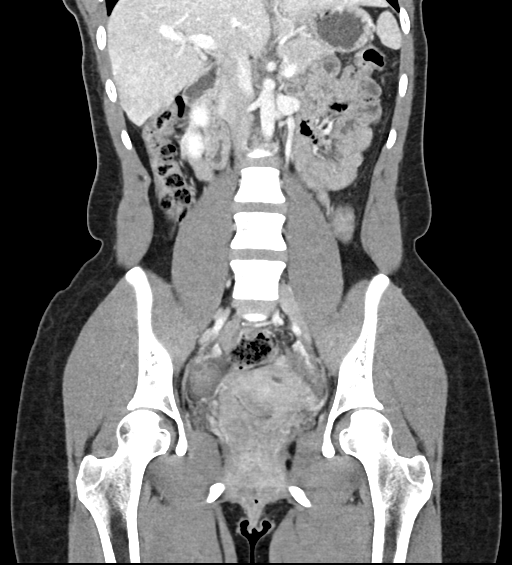

[16 of 46 positions shown; findings below may reference images not displayed]

RADIATION DOSE REDUCTION: This exam was performed according to the
departmental dose-optimization program which includes automated
exposure control, adjustment of the mA and/or kV according to
patient size and/or use of iterative reconstruction technique.

CONTRAST:  80mL OMNIPAQUE IOHEXOL 300 MG/ML  SOLN
FINDINGS: Lower chest: No acute abnormality.

Hepatobiliary: Several small subcentimeter low-density foci are
identified within the liver which are technically too small to
reliably characterize. No suspicious liver lesion identified.
Gallbladder appears normal. No bile duct dilatation.

Pancreas: Unremarkable. No pancreatic ductal dilatation or
surrounding inflammatory changes.

Spleen: Normal adrenal glands. No kidney mass, nephrolithiasis, or
hydronephrosis. Urinary bladder appears normal.

Adrenals/Urinary Tract: Adrenal glands are unremarkable. Kidneys are
normal. No renal calculi, focal lesion or hydronephrosis.
Unremarkable appearance of the urinary bladder.

Stomach/Bowel: The stomach appears normal. The appendix is
visualized and appears normal. No small bowel wall thickening,
inflammation or distension. There is a moderate volume of retained
stool within the mid sigmoid colon, image 45/5. Distal to this
segment is a short segment of focal luminal narrowing with mucosal
enhancement and mild wall edema, image 40/5. This is followed by a
mildly distended rectum containing a moderate amount of desiccated
stool with mucosal enhancement and mild perirectal haziness.

Vascular/Lymphatic: No significant vascular findings are present. No
enlarged abdominal or pelvic lymph nodes.

Reproductive: The uterus and adnexal structures have a normal
appearance. No adnexal mass noted.

Other: Trace fluid noted within the pelvis. This is a nonspecific
finding in a premenopausal female. No signs of pneumoperitoneum.

Musculoskeletal: No acute or significant osseous findings.
IMPRESSION: 1. There is a moderate volume of retained stool within the mid
sigmoid colon. Distal to this segment is a short segment of focal
luminal narrowing with mucosal enhancement and mild wall edema. This
is followed by a mildly distended rectum containing a moderate
amount of desiccated stool with mucosal enhancement and mild
perirectal haziness. Correlate for any clinical signs of distal
colitis or stercoral colitis.
2. No signs of bowel obstruction, perforation or abscess.
3. Normal appendix.

## 2022-11-13 ENCOUNTER — Ambulatory Visit
Admission: EM | Admit: 2022-11-13 | Discharge: 2022-11-13 | Disposition: A | Discharge status: Home / Self Care | Payer: BC Managed Care – PPO | Attending: Nurse Practitioner | Admitting: Nurse Practitioner

## 2022-11-13 DIAGNOSIS — S46811A Strain of other muscles, fascia and tendons at shoulder and upper arm level, right arm, initial encounter: Secondary | ICD-10-CM | POA: Diagnosis not present

## 2022-11-13 MED ORDER — NAPROXEN 500 MG PO TABS: 500.0000 mg | ORAL_TABLET | Freq: Two times a day (BID) | ORAL | 0 refills | Status: AC

## 2022-11-13 MED ORDER — CYCLOBENZAPRINE HCL 10 MG PO TABS: 10.0000 mg | ORAL_TABLET | Freq: Two times a day (BID) | ORAL | 0 refills | Status: DC | PRN

## 2022-11-13 NOTE — Discharge Instructions (Signed)
Naproxen twice daily for 5 days.  Take this with food Flexeril as needed.  Please note this medication can make you drowsy.  Do not drink alcohol or drive while on this medication Heat to the area as needed Rest Follow-up with your PCP if your symptoms do not improve Please go to the ER for any worsening symptoms

## 2022-11-13 NOTE — ED Triage Notes (Signed)
Pt presents with c/o sharp pain on the right shoulder. States the pain stared after lifting weights at the gym yesterday. States it is not the first time that it has happened in the same. Expresses the pain radiates to her neck.

## 2022-11-13 NOTE — ED Provider Notes (Signed)
[facility]    CSN: [id number] Arrival date & time: 11/13/22  1259      History   Chief Complaint Chief Complaint  Patient presents with   Shoulder Pain    HPI Tamara Rodriguez is a 27 y.o. female presents for evaluation of muscle strain.  Patient states she was working out yesterday during overhead lifts when she pulled her left posterior shoulder.  Since then she reports a constant aching to her left trapezius muscle that is worse with head rotation and flexion.  Pain does occasionally radiate Up into her neck but she denies neck pain.  Denies any numbness/tingling/weakness of her upper extremities.  Reports she has had something similar in the past that she is just let it run its course but it seems more persistent this time.  She has not taken any OTC medications for symptoms.  No other concerns at this time.   Shoulder Pain   Past Medical History:  Diagnosis Date   Anxiety    Depression     Patient Active Problem List   Diagnosis Date Noted   LGSIL on Pap smear of cervix 12/02/2020   MDD (major depressive disorder), recurrent episode, moderate (Max)    Depression 03/18/2018   Chlamydia 11/29/2015   Irregular menses 11/26/2015   Encounter for Depo-Provera contraception 11/26/2015   Right ankle sprain 05/16/2011    History reviewed. No pertinent surgical history.  OB History     Gravida  0   Para  0   Term  0   Preterm  0   AB  0   Living  0      SAB  0   IAB  0   Ectopic  0   Multiple  0   Live Births  0            Home Medications    Prior to Admission medications   Medication Sig Start Date End Date Taking? Authorizing Provider  cyclobenzaprine (FLEXERIL) 10 MG tablet Take 1 tablet (10 mg total) by mouth 2 (two) times daily as needed for muscle spasms. 11/13/22  Yes Melynda Ripple, NP  naproxen (NAPROSYN) 500 MG tablet Take 1 tablet (500 mg total) by mouth 2 (two) times daily. 11/13/22  Yes Melynda Ripple, NP  benzonatate  (TESSALON) 100 MG capsule Take 1 capsule (100 mg total) by mouth every 8 (eight) hours. 04/09/22   Tegeler, Gwenyth Allegra, MD  ciprofloxacin (CIPRO) 500 MG tablet Take 1 tablet (500 mg total) by mouth 2 (two) times daily. 03/28/22   Jaynee Eagles, PA-C  hydrOXYzine (ATARAX/VISTARIL) 25 MG tablet Take 1 tablet (25 mg total) by mouth every 6 (six) hours as needed for anxiety. Patient not taking: Reported on 03/28/2022 04/07/21   Wieters, Madelynn Done C, PA-C  loperamide (IMODIUM) 2 MG capsule Take 1 capsule (2 mg total) by mouth 2 (two) times daily as needed for diarrhea or loose stools. 03/28/22   Jaynee Eagles, PA-C  ondansetron (ZOFRAN) 4 MG tablet Take 1 tablet (4 mg total) by mouth every 8 (eight) hours as needed for nausea or vomiting. 04/09/22   Tegeler, Gwenyth Allegra, MD  ondansetron (ZOFRAN-ODT) 8 MG disintegrating tablet Take 1 tablet (8 mg total) by mouth every 8 (eight) hours as needed for nausea or vomiting. 03/28/22   Jaynee Eagles, PA-C  polyethylene glycol (MIRALAX / GLYCOLAX) 17 g packet Take 17 g by mouth daily. Patient not taking: Reported on 03/28/2022 [id number]   Prince Rome, PA-C  valACYclovir (VALTREX) 1000 MG tablet Take 1 tablet (1,000 mg total) by mouth 2 (two) times daily. Patient not taking: Reported on 03/28/2022 05/11/21   Volney American, PA-C    Family History Family History  Problem Relation Age of Onset   Diabetes Father    Hypertension Father    Asthma Mother    Hypertension Maternal Aunt    Cancer Maternal Aunt    Hypertension Maternal Uncle    Diabetes Paternal Aunt    Diabetes Paternal Uncle    Hypertension Maternal Grandmother    Cancer Maternal Grandmother    Hypertension Paternal Grandmother    Heart attack Neg Hx    Hyperlipidemia Neg Hx    Sudden death Neg Hx     Social History Social History   Tobacco Use   Smoking status: Never   Smokeless tobacco: Never  Vaping Use   Vaping Use: Never used  Substance Use Topics   Alcohol use: Yes   Drug use: No      Allergies   Latex   Review of Systems Review of Systems  Musculoskeletal:        Right posterior shoulder pain     Physical Exam Triage Vital Signs ED Triage Vitals [11/13/22 1325]  Enc Vitals Group     BP 96/63     Pulse Rate 73     Resp 18     Temp 98.2 F (36.8 C)     Temp Source Oral     SpO2 98 %     Weight      Height      Head Circumference      Peak Flow      Pain Score      Pain Loc      Pain Edu?      Excl. in [place]?    No data found.  Updated Vital Signs BP 96/63 (BP Location: Left Arm)   Pulse 73   Temp 98.2 F (36.8 C) (Oral)   Resp 18   LMP 10/09/2022 (Exact Date)   SpO2 98%   Visual Acuity Right Eye Distance:   Left Eye Distance:   Bilateral Distance:    Right Eye Near:   Left Eye Near:    Bilateral Near:     Physical Exam Vitals and nursing note reviewed.  Constitutional:      Appearance: Normal appearance.  HENT:     Head: Normocephalic and atraumatic.  Eyes:     Pupils: Pupils are equal, round, and reactive to light.  Cardiovascular:     Rate and Rhythm: Normal rate.  Pulmonary:     Effort: Pulmonary effort is normal.  Musculoskeletal:     Right shoulder: Tenderness present. No swelling, deformity, effusion, laceration, bony tenderness or crepitus. Normal range of motion. Normal strength. Normal pulse.     Comments: Tender to palpation to the right trapezius.  Strength 5 out of 5 bilateral upper extremities.  No spinal tenderness from cervical to thoracic spine.  No para cervical muscle tenderness with palpation.  Skin:    General: Skin is warm and dry.  Neurological:     General: No focal deficit present.     Mental Status: She is alert and oriented to person, place, and time.  Psychiatric:        Mood and Affect: Mood normal.        Behavior: Behavior normal.      UC Treatments / Results  Labs (all labs ordered are listed, but  only abnormal results are displayed) Labs Reviewed - No data to  display  EKG   Radiology No results found.  Procedures Procedures (including critical care time)  Medications Ordered in UC Medications - No data to display  Initial Impression / Assessment and Plan / UC Course  I have reviewed the triage vital signs and the nursing notes.  Pertinent labs & imaging results that were available during my care of the patient were reviewed by me and considered in my medical decision making (see chart for details).     Discussed trapezius muscle strain Flexeril as needed.  Side effect profile reviewed Naproxen twice daily for 5 days Heat to the area Rest PCP follow-up if symptoms do not improve ER precautions reviewed and patient verbalized understanding Final Clinical Impressions(s) / UC Diagnoses   Final diagnoses:  Strain of right trapezius muscle, initial encounter     Discharge Instructions      Naproxen twice daily for 5 days.  Take this with food Flexeril as needed.  Please note this medication can make you drowsy.  Do not drink alcohol or drive while on this medication Heat to the area as needed Rest Follow-up with your PCP if your symptoms do not improve Please go to the ER for any worsening symptoms   ED Prescriptions     Medication Sig Dispense Auth. Provider   cyclobenzaprine (FLEXERIL) 10 MG tablet Take 1 tablet (10 mg total) by mouth 2 (two) times daily as needed for muscle spasms. 10 tablet Melynda Ripple, NP   naproxen (NAPROSYN) 500 MG tablet Take 1 tablet (500 mg total) by mouth 2 (two) times daily. 10 tablet Melynda Ripple, NP      PDMP not reviewed this encounter.   Melynda Ripple, NP 11/13/22 1348

## 2023-01-25 ENCOUNTER — Encounter (HOSPITAL_BASED_OUTPATIENT_CLINIC_OR_DEPARTMENT_OTHER): Payer: Self-pay | Admitting: Emergency Medicine

## 2023-01-25 ENCOUNTER — Other Ambulatory Visit: Payer: Self-pay

## 2023-01-25 ENCOUNTER — Emergency Department (HOSPITAL_BASED_OUTPATIENT_CLINIC_OR_DEPARTMENT_OTHER)
Admission: EM | Admit: 2023-01-25 | Discharge: 2023-01-25 | Disposition: A | Discharge status: Home / Self Care | Payer: BC Managed Care – PPO | Attending: Emergency Medicine | Admitting: Emergency Medicine

## 2023-01-25 DIAGNOSIS — D649 Anemia, unspecified: Secondary | ICD-10-CM | POA: Insufficient documentation

## 2023-01-25 DIAGNOSIS — N939 Abnormal uterine and vaginal bleeding, unspecified: Secondary | ICD-10-CM | POA: Insufficient documentation

## 2023-01-25 DIAGNOSIS — Z91040 Latex allergy status: Secondary | ICD-10-CM | POA: Diagnosis not present

## 2023-01-25 LAB — CBC
HCT: 36.7 % (ref 36.0–46.0)
Hemoglobin: 11.9 g/dL — ABNORMAL LOW (ref 12.0–15.0)
MCH: 28.6 pg (ref 26.0–34.0)
MCHC: 32.4 g/dL (ref 30.0–36.0)
MCV: 88.2 fL (ref 80.0–100.0)
Platelets: 243 10*3/uL (ref 150–400)
RBC: 4.16 MIL/uL (ref 3.87–5.11)
RDW: 14.4 % (ref 11.5–15.5)
WBC: 4.6 10*3/uL (ref 4.0–10.5)
nRBC: 0 % (ref 0.0–0.2)

## 2023-01-25 LAB — PREGNANCY, URINE: Preg Test, Ur: NEGATIVE

## 2023-01-25 NOTE — ED Provider Notes (Signed)
EMERGENCY DEPARTMENT AT [facility] Provider Note   CSN: [id number] Arrival date & time: 01/25/23  1429     History  Chief Complaint  Patient presents with   Vaginal Bleeding    Tamara Rodriguez is a 27 y.o. female.  Patient with history of irregular menstrual cycles presents today with complaints of vaginal bleeding. She states that her cycle initially began about 2 weeks ago and was normal for her, however it has been persistent since then. She states that in the past few days it has been increasing in amount to the point that she is going through about 1 pad per hour. She states this has happened before due to her irregular menses but has never lasted quite this long. She has never been anemic or required a blood transfusion for this. She called her OB/GYN however they cannot see her until next week. She denies any abdominal pain or vaginal discharge. She is not currently on any form of birth control.   The history is provided by the patient. No language interpreter was used.  Vaginal Bleeding      Home Medications Prior to Admission medications   Medication Sig Start Date End Date Taking? Authorizing Provider  benzonatate (TESSALON) 100 MG capsule Take 1 capsule (100 mg total) by mouth every 8 (eight) hours. 04/09/22   Tegeler, Canary Brim, MD  ciprofloxacin (CIPRO) 500 MG tablet Take 1 tablet (500 mg total) by mouth 2 (two) times daily. 03/28/22   Wallis Bamberg, PA-C  cyclobenzaprine (FLEXERIL) 10 MG tablet Take 1 tablet (10 mg total) by mouth 2 (two) times daily as needed for muscle spasms. 11/13/22   Radford Pax, NP  hydrOXYzine (ATARAX/VISTARIL) 25 MG tablet Take 1 tablet (25 mg total) by mouth every 6 (six) hours as needed for anxiety. Patient not taking: Reported on 03/28/2022 04/07/21   Wieters, Fran Lowes C, PA-C  loperamide (IMODIUM) 2 MG capsule Take 1 capsule (2 mg total) by mouth 2 (two) times daily as needed for diarrhea or loose stools. 03/28/22   Wallis Bamberg, PA-C  naproxen (NAPROSYN) 500 MG tablet Take 1 tablet (500 mg total) by mouth 2 (two) times daily. 11/13/22   Radford Pax, NP  ondansetron (ZOFRAN) 4 MG tablet Take 1 tablet (4 mg total) by mouth every 8 (eight) hours as needed for nausea or vomiting. 04/09/22   Tegeler, Canary Brim, MD  ondansetron (ZOFRAN-ODT) 8 MG disintegrating tablet Take 1 tablet (8 mg total) by mouth every 8 (eight) hours as needed for nausea or vomiting. 03/28/22   Wallis Bamberg, PA-C  polyethylene glycol (MIRALAX / GLYCOLAX) 17 g packet Take 17 g by mouth daily. Patient not taking: Reported on 03/28/2022 02/05/22   Cecil Cobbs, PA-C  valACYclovir (VALTREX) 1000 MG tablet Take 1 tablet (1,000 mg total) by mouth 2 (two) times daily. Patient not taking: Reported on 03/28/2022 05/11/21   Particia Nearing, PA-C      Allergies    Latex    Review of Systems   Review of Systems  Genitourinary:  Positive for vaginal bleeding.  All other systems reviewed and are negative.   Physical Exam Updated Vital Signs BP 99/65 (BP Location: Right Arm)   Pulse 69   Temp 98.5 F (36.9 C) (Oral)   Resp 17   SpO2 100%  Physical Exam Vitals and nursing note reviewed. Exam conducted with a chaperone present.  Constitutional:      General: She is not in acute distress.  Appearance: Normal appearance. She is normal weight. She is not ill-appearing, toxic-appearing or diaphoretic.  HENT:     Head: Normocephalic and atraumatic.  Cardiovascular:     Rate and Rhythm: Normal rate.  Pulmonary:     Effort: Pulmonary effort is normal. No respiratory distress.  Abdominal:     General: Abdomen is flat.     Palpations: Abdomen is soft.     Tenderness: There is no abdominal tenderness.  Genitourinary:    Comments: Dark red blood present in the vagina on exam. No CMT or discharge present.  Musculoskeletal:        General: Normal range of motion.     Cervical back: Normal range of motion.  Skin:    General: Skin is warm  and dry.  Neurological:     General: No focal deficit present.     Mental Status: She is alert.  Psychiatric:        Mood and Affect: Mood normal.        Behavior: Behavior normal.     ED Results / Procedures / Treatments   Labs (all labs ordered are listed, but only abnormal results are displayed) Labs Reviewed  CBC - Abnormal; Notable for the following components:      Result Value   Hemoglobin 11.9 (*)    All other components within normal limits  PREGNANCY, URINE    EKG None  Radiology No results found.  Procedures Procedures    Medications Ordered in ED Medications - No data to display  ED Course/ Medical Decision Making/ A&P                             Medical Decision Making Amount and/or Complexity of Data Reviewed Labs: ordered.   This patient is a 27 y.o. female who presents to the ED for concern of vaginal bleeding, this involves an extensive number of treatment options, and is a complaint that carries with it a high risk of complications and morbidity. The emergent differential diagnosis prior to evaluation includes, but is not limited to,  Abnormal uterine bleeding, threatened miscarriage, incomplete miscarriage, normal bleeding from an early trimester pregnancy, ectopic pregnancy, vaginal/cervical trauma, subchorionic hemorrhage/hematoma  This is not an exhaustive differential.   Past Medical History / Co-morbidities / Social History: history of irregular menstrual cycles   Physical Exam: Physical exam performed. The pertinent findings include: per above, dark red blood present in the vagina, no signs of acute hemorrhage.   Lab Tests: I ordered, and personally interpreted labs.  The pertinent results include:  hgb 11.9 down from 12.1 10 months ago. No other acute laboratory findings   Disposition: After consideration of the diagnostic results and the patients response to treatment, I feel that emergency department workup does not suggest an  emergent condition requiring admission or immediate intervention beyond what has been performed at this time. The plan is: discharge with close OB/GYN follow-up. Patients hemoglobin is stable and exam does not reveal any signs of active hemorrhage. She has an appointment next week for follow-up. Emphasized importance of keeping this appointment.  Evaluation and diagnostic testing in the emergency department does not suggest an emergent condition requiring admission or immediate intervention beyond what has been performed at this time.  Plan for discharge with close PCP follow-up.  Patient is understanding and amenable with plan, educated on red flag symptoms that would prompt immediate return.  Patient discharged in stable condition.  Findings and  plan of care discussed with supervising physician Dr. Adela Lank who is in agreement.   Final Clinical Impression(s) / ED Diagnoses Final diagnoses:  Vaginal bleeding    Rx / DC Orders ED Discharge Orders     None     An After Visit Summary was printed and given to the patient.     Silva Bandy, PA-C 01/25/23 1821    Melene Plan, DO 01/25/23 2051

## 2023-01-25 NOTE — ED Notes (Signed)
Pt given discharge instructions. Opportunities given for questions. Pt verbalizes understanding. Natina Wiginton R, RN 

## 2023-01-25 NOTE — ED Triage Notes (Signed)
Pt presents to ED POV. Pt c/o heavy vaginal bleeding for the last couple days. Pt reports that she has had vaginal bleeding x2w but worsened. Pt reports soaking through pad more than once per hour and large clots.

## 2023-01-25 NOTE — Discharge Instructions (Addendum)
As we discussed, your workup in the ER today was reassuring for acute findings.  Your hemoglobin was stable and therefore there is no indication for further intervention today.  I recommend that you follow-up with your OB/GYN for continued evaluation and management of your bleeding.  Return if development of any new or worsening symptoms.

## 2023-02-26 ENCOUNTER — Emergency Department (HOSPITAL_BASED_OUTPATIENT_CLINIC_OR_DEPARTMENT_OTHER): Payer: BC Managed Care – PPO | Admitting: Radiology

## 2023-02-26 ENCOUNTER — Other Ambulatory Visit: Payer: Self-pay

## 2023-02-26 ENCOUNTER — Encounter (HOSPITAL_BASED_OUTPATIENT_CLINIC_OR_DEPARTMENT_OTHER): Payer: Self-pay

## 2023-02-26 DIAGNOSIS — Z5321 Procedure and treatment not carried out due to patient leaving prior to being seen by health care provider: Secondary | ICD-10-CM | POA: Diagnosis not present

## 2023-02-26 DIAGNOSIS — M79671 Pain in right foot: Secondary | ICD-10-CM | POA: Insufficient documentation

## 2023-02-26 NOTE — ED Triage Notes (Signed)
Pt reports running on last Friday and had right foot pain and swelling after denies falling. Pt reports pain/pressure on outer aspect of foot. CMS intact.   GCS 15/ a/ox4.

## 2023-02-27 ENCOUNTER — Emergency Department (HOSPITAL_BASED_OUTPATIENT_CLINIC_OR_DEPARTMENT_OTHER)
Admission: EM | Admit: 2023-02-27 | Discharge: 2023-02-27 | Disposition: A | Discharge status: Home / Self Care | Payer: BC Managed Care – PPO | Source: Home / Self Care | Attending: Emergency Medicine | Admitting: Emergency Medicine

## 2023-02-27 ENCOUNTER — Emergency Department (HOSPITAL_BASED_OUTPATIENT_CLINIC_OR_DEPARTMENT_OTHER)
Admission: EM | Admit: 2023-02-27 | Discharge: 2023-02-27 | Discharge status: Against Medical Advice | Payer: BC Managed Care – PPO | Attending: Emergency Medicine | Admitting: Emergency Medicine

## 2023-02-27 ENCOUNTER — Other Ambulatory Visit: Payer: Self-pay

## 2023-02-27 ENCOUNTER — Encounter (HOSPITAL_BASED_OUTPATIENT_CLINIC_OR_DEPARTMENT_OTHER): Payer: Self-pay

## 2023-02-27 DIAGNOSIS — M79671 Pain in right foot: Secondary | ICD-10-CM | POA: Diagnosis not present

## 2023-02-27 MED ORDER — CELECOXIB 200 MG PO CAPS: 200.0000 mg | ORAL_CAPSULE | Freq: Two times a day (BID) | ORAL | 0 refills | Status: AC

## 2023-02-27 NOTE — ED Triage Notes (Signed)
Patient here POV from Home.  Endorses Pain to Right Foot. No Discernable Injury but recent use (running). Seen yesterday and had XR imaging but LWBS.   NAD Noted During Triage. A&Ox4. GCS 15. Ambulatory.

## 2023-02-27 NOTE — ED Notes (Signed)
Pt called to be roomed, no answer  

## 2023-02-27 NOTE — ED Provider Notes (Signed)
[place] EMERGENCY DEPARTMENT AT [facility] Provider Note   CSN: [id number] Arrival date & time: 02/27/23  1757     History Chief Complaint  Patient presents with   Foot Pain    HPI Tamara Rodriguez is a 27 y.o. female presenting for chief complaint of right foot pain.  States that she went running after a kid at work 2 days ago.  She was not wearing proper footwear in a field and had significant pain in her right distal metatarsal.  She denies fevers chills nausea vomiting syncope shortness of breath.  Otherwise ambulatory tolerating p.o. intake. Significant pain with ambulation states that she is supposed to return to work tomorrow but is having difficulty standing on her feet.   Patient's recorded medical, surgical, social, medication list and allergies were reviewed in the Snapshot window as part of the initial history.   Review of Systems   Review of Systems  Constitutional:  Negative for chills and fever.  HENT:  Negative for ear pain and sore throat.   Eyes:  Negative for pain and visual disturbance.  Respiratory:  Negative for cough and shortness of breath.   Cardiovascular:  Negative for chest pain and palpitations.  Gastrointestinal:  Negative for abdominal pain and vomiting.  Genitourinary:  Negative for dysuria and hematuria.  Musculoskeletal:  Negative for arthralgias and back pain.  Skin:  Negative for color change and rash.  Neurological:  Negative for seizures and syncope.  All other systems reviewed and are negative.   Physical Exam Updated Vital Signs BP (!) 120/102 (BP Location: Right Arm)   Pulse 68   Temp 97.8 F (36.6 C) (Temporal)   Resp 18   Ht 5' (1.524 m)   Wt 50.8 kg   LMP 02/26/2023 (Approximate)   SpO2 97%   BMI 21.87 kg/m  Physical Exam Vitals and nursing note reviewed.  Constitutional:      General: She is not in acute distress.    Appearance: She is well-developed.  HENT:     Head: Normocephalic and atraumatic.  Eyes:      Conjunctiva/sclera: Conjunctivae normal.  Cardiovascular:     Rate and Rhythm: Normal rate and regular rhythm.     Heart sounds: No murmur heard. Pulmonary:     Effort: Pulmonary effort is normal. No respiratory distress.     Breath sounds: Normal breath sounds.  Abdominal:     General: There is no distension.     Palpations: Abdomen is soft.     Tenderness: There is no abdominal tenderness. There is no right CVA tenderness or left CVA tenderness.  Musculoskeletal:        General: Tenderness (TTP of the right fifth metatarsophalangeal joint.) present. No swelling. Normal range of motion.     Cervical back: Neck supple.  Skin:    General: Skin is warm and dry.  Neurological:     General: No focal deficit present.     Mental Status: She is alert and oriented to person, place, and time. Mental status is at baseline.     Cranial Nerves: No cranial nerve deficit.      ED Course/ Medical Decision Making/ A&P    Procedures Procedures   Medications Ordered in ED Medications - No data to display  Medical Decision Making:   27 year old female present with foot pain.  X-ray done yesterday with no acute fracture.  Reviewed images independently.  Not consistent with Lisfranc injury.  Supportive care, rest ice elevation compression reinforced patient  expressed understanding.  Referral to podiatry in outpatient setting should patient have continued symptoms.    Disposition:  I have considered need for hospitalization, however, considering all of the above, I believe this patient is stable for discharge at this time.  Patient/family educated about specific return precautions for given chief complaint and symptoms.  Patient/family educated about follow-up with PCP.     Patient/family expressed understanding of return precautions and need for follow-up. Patient spoken to regarding all imaging and laboratory results and appropriate follow up for these results. All education provided in verbal  form with additional information in written form. Time was allowed for answering of patient questions. Patient discharged.    Emergency Department Medication Summary:   Medications - No data to display     Clinical Impression:  1. Right foot pain      Discharge   Final Clinical Impression(s) / ED Diagnoses Final diagnoses:  Right foot pain    Rx / DC Orders ED Discharge Orders          Ordered    celecoxib (CELEBREX) 200 MG capsule  2 times daily        Pending              Glyn Ade, MD 02/27/23 2118

## 2023-03-16 ENCOUNTER — Other Ambulatory Visit: Payer: Self-pay

## 2023-03-16 ENCOUNTER — Emergency Department (HOSPITAL_BASED_OUTPATIENT_CLINIC_OR_DEPARTMENT_OTHER)
Admission: EM | Admit: 2023-03-16 | Discharge: 2023-03-16 | Disposition: A | Discharge status: Home / Self Care | Payer: BC Managed Care – PPO | Attending: Emergency Medicine | Admitting: Emergency Medicine

## 2023-03-16 ENCOUNTER — Encounter (HOSPITAL_BASED_OUTPATIENT_CLINIC_OR_DEPARTMENT_OTHER): Payer: Self-pay | Admitting: Emergency Medicine

## 2023-03-16 DIAGNOSIS — M436 Torticollis: Secondary | ICD-10-CM | POA: Diagnosis not present

## 2023-03-16 DIAGNOSIS — Z91040 Latex allergy status: Secondary | ICD-10-CM | POA: Diagnosis not present

## 2023-03-16 DIAGNOSIS — M62838 Other muscle spasm: Secondary | ICD-10-CM

## 2023-03-16 DIAGNOSIS — S46811A Strain of other muscles, fascia and tendons at shoulder and upper arm level, right arm, initial encounter: Secondary | ICD-10-CM

## 2023-03-16 MED ORDER — CYCLOBENZAPRINE HCL 10 MG PO TABS: 10.0000 mg | ORAL_TABLET | Freq: Two times a day (BID) | ORAL | 0 refills | Status: DC | PRN

## 2023-03-16 MED ORDER — CYCLOBENZAPRINE HCL 10 MG PO TABS: 10.0000 mg | ORAL_TABLET | Freq: Two times a day (BID) | ORAL | 0 refills | Status: AC | PRN

## 2023-03-16 NOTE — ED Provider Notes (Signed)
[place] EMERGENCY DEPARTMENT AT [facility] Provider Note   CSN: [id number] Arrival date & time: 03/16/23  4098     History  Chief Complaint  Patient presents with   Torticollis    Tamara Rodriguez is a 27 y.o. female.  Patient comes in for neck pain that started last night while studying. Patient was looking at her books and neck began to hurt. As time went on, neck got more stiff and painful. She attempted to go to sleep but has had neck pain all night, elicited by moving her upper body. She describes pain as sharp and intense w/ movement. She takes no medications, and is in usual state of health, no fever, nausea, or vomitting. She reports she get's stiffness in her shoulder from time to time, and has used a muscle relaxer for it, but has never had this issue in her neck. She also notes that the pain is radiating to her right shoulder a little bit.   The history is provided by the patient.       Home Medications Prior to Admission medications   Medication Sig Start Date End Date Taking? Authorizing Provider  benzonatate (TESSALON) 100 MG capsule Take 1 capsule (100 mg total) by mouth every 8 (eight) hours. 04/09/22   Tegeler, Canary Brim, MD  celecoxib (CELEBREX) 200 MG capsule Take 1 capsule (200 mg total) by mouth 2 (two) times daily. 02/27/23   Glyn Ade, MD  ciprofloxacin (CIPRO) 500 MG tablet Take 1 tablet (500 mg total) by mouth 2 (two) times daily. 03/28/22   Wallis Bamberg, PA-C  cyclobenzaprine (FLEXERIL) 10 MG tablet Take 1 tablet (10 mg total) by mouth 2 (two) times daily as needed for muscle spasms. 11/13/22   Radford Pax, NP  hydrOXYzine (ATARAX/VISTARIL) 25 MG tablet Take 1 tablet (25 mg total) by mouth every 6 (six) hours as needed for anxiety. Patient not taking: Reported on 03/28/2022 04/07/21   Wieters, Fran Lowes C, PA-C  loperamide (IMODIUM) 2 MG capsule Take 1 capsule (2 mg total) by mouth 2 (two) times daily as needed for diarrhea or loose stools.  03/28/22   Wallis Bamberg, PA-C  naproxen (NAPROSYN) 500 MG tablet Take 1 tablet (500 mg total) by mouth 2 (two) times daily. 11/13/22   Radford Pax, NP  ondansetron (ZOFRAN) 4 MG tablet Take 1 tablet (4 mg total) by mouth every 8 (eight) hours as needed for nausea or vomiting. 04/09/22   Tegeler, Canary Brim, MD  ondansetron (ZOFRAN-ODT) 8 MG disintegrating tablet Take 1 tablet (8 mg total) by mouth every 8 (eight) hours as needed for nausea or vomiting. 03/28/22   Wallis Bamberg, PA-C  polyethylene glycol (MIRALAX / GLYCOLAX) 17 g packet Take 17 g by mouth daily. Patient not taking: Reported on 03/28/2022 02/05/22   Cecil Cobbs, PA-C  valACYclovir (VALTREX) 1000 MG tablet Take 1 tablet (1,000 mg total) by mouth 2 (two) times daily. Patient not taking: Reported on 03/28/2022 05/11/21   Particia Nearing, PA-C      Allergies    Latex    Review of Systems   Review of Systems  Constitutional:  Positive for activity change.  Musculoskeletal:  Positive for neck pain and neck stiffness.    Physical Exam Updated Vital Signs BP 111/75   Pulse 72   Temp 98 F (36.7 C) (Oral)   Resp 16   LMP 02/26/2023 (Approximate)   SpO2 100%  Physical Exam Constitutional:      General: She  is not in acute distress.    Appearance: Normal appearance. She is normal weight. She is not ill-appearing.  Neck:   Musculoskeletal:     Cervical back: Rigidity and tenderness (ttp on right cervicle paraspinal muscle) present. No edema, erythema, signs of trauma, torticollis or crepitus. Pain with movement and muscular tenderness present. No spinous process tenderness. Decreased range of motion.  Neurological:     Mental Status: She is alert.     ED Results / Procedures / Treatments   Labs (all labs ordered are listed, but only abnormal results are displayed) Labs Reviewed - No data to display  EKG None  Radiology No results found.  Procedures Procedures    Medications Ordered in ED Medications -  No data to display  ED Course/ Medical Decision Making/ A&P                             Medical Decision Making Patient comes in for neck pain, that began last night while studying. Patient was looking at books when pain and stiffness set in. It progressively got worse as the night went on. Patient now having issues moving her upper body 2/2 it illiciting pain. Patient takes no medications at baseline. Patient's physical exam showed TTP in right cervicle paraspinal muscle, and DROM 2/2 to pain. Patient's muscles not stiff. As patient's neck is not flexed/turned, and patient not taking any medication that could cause torticollis. Patient most likely suffering from muscle spasm of neck, from studying. Will recommend short course of muscle relaxer.   Through shared decision making, patient wanting to take medicine at home, as opposed to in the ED, for fear that it will heavily sedate her, and cause her to have to nap in the ED. Told patient that she would not be safe to drive on the medicine. Patient in agreement.   Risk Prescription drug management.           Final Clinical Impression(s) / ED Diagnoses Final diagnoses:  None    Rx / DC Orders ED Discharge Orders     None         Bess Kinds, MD 03/16/23 4098    Blane Ohara, MD 03/16/23 2257

## 2023-03-16 NOTE — ED Triage Notes (Signed)
Pt arrives to ED with c/o neck pain x2 days.

## 2023-03-16 NOTE — Discharge Instructions (Addendum)
Today we saw you for neck pain/stiffness.  Through evaluation and your history, we determined that you are likely having a neck spasm. We will treat this with a short course of muscle relaxer's, to be taken twice a day, as needed.   Flexiril 10 mg, twice a day, as needed  Caution: Do not drive, operate machinery, or manage important task while on this medication, as it is sedating.   Seek emergency medical care if: Your neck stiffness worsens You develop issues breathing or swallowing

## 2023-03-16 NOTE — ED Notes (Signed)
Patient verbalizes understanding of discharge instructions. Opportunity for questioning and answers were provided. Patient discharged from ED.  °

## 2023-04-09 ENCOUNTER — Ambulatory Visit (INDEPENDENT_AMBULATORY_CARE_PROVIDER_SITE_OTHER): Payer: BC Managed Care – PPO

## 2023-04-09 ENCOUNTER — Ambulatory Visit (INDEPENDENT_AMBULATORY_CARE_PROVIDER_SITE_OTHER): Payer: BC Managed Care – PPO | Admitting: Podiatry

## 2023-04-09 DIAGNOSIS — M79671 Pain in right foot: Secondary | ICD-10-CM

## 2023-04-09 DIAGNOSIS — M7751 Other enthesopathy of right foot: Secondary | ICD-10-CM

## 2023-04-09 DIAGNOSIS — M21621 Bunionette of right foot: Secondary | ICD-10-CM

## 2023-04-09 MED ORDER — IBUPROFEN 800 MG PO TABS
800.0000 mg | ORAL_TABLET | Freq: Three times a day (TID) | ORAL | 0 refills | Status: DC | PRN
Start: 2023-04-09 — End: 2024-03-07

## 2023-04-09 NOTE — Progress Notes (Signed)
  Subjective:  Patient ID: Tamara Rodriguez, female    DOB: 11/06/1995,  MRN: [id number]  Chief Complaint  Patient presents with   Foot Pain    Right foot injury that happened about a month ago and it has not stopped hurting since then.    27 y.o. female presents with the above complaint. History confirmed with patient.  It hurts on the side of the foot towards the fifth toe  Objective:  Physical Exam: warm, good capillary refill, no trophic changes or ulcerative lesions, normal DP and PT pulses, normal sensory exam, and slight tenderness over fifth metatarsal head, tailor's bunion deformity noted.   Radiographs: Multiple views x-ray of the right foot: No evidence of fracture or stress fracture, she has tailor's bunion deformity Assessment:   1. Bursitis of right foot   2. Right foot pain   3. Tailor's bunion of right foot      Plan:  Patient was evaluated and treated and all questions answered.  Reviewed her radiographs discussed the presence of the tailor's bunion deformity and how to care for these with nonoperative treatment with offloading silicone padding which she will try, appropriate shoe gear shape and material use of NSAIDs and anti-inflammatories as needed.  Rx for ibuprofen 800 mg sent to pharmacy.  We also discussed long-term surgical correction could be undertaken if not improving.  Return if symptoms worsen or fail to improve.

## 2023-10-16 ENCOUNTER — Ambulatory Visit
Admission: EM | Admit: 2023-10-16 | Discharge: 2023-10-16 | Disposition: A | Discharge status: Home / Self Care | Payer: Medicaid Other | Attending: Family Medicine | Admitting: Family Medicine

## 2023-10-16 DIAGNOSIS — B349 Viral infection, unspecified: Secondary | ICD-10-CM

## 2023-10-16 LAB — POCT INFLUENZA A/B
Influenza A, POC: NEGATIVE
Influenza B, POC: NEGATIVE

## 2023-10-16 MED ORDER — PROMETHAZINE-DM 6.25-15 MG/5ML PO SYRP: 5.0000 mL | ORAL_SOLUTION | Freq: Four times a day (QID) | ORAL | 0 refills | Status: AC | PRN

## 2023-10-16 NOTE — Discharge Instructions (Signed)
Please treat your symptoms with over the counter  tylenol or ibuprofen, humidifier, and rest.  Promethazine DM as needed for your cough.  Please note this medication can make you drowsy.  Do not drink alcohol or drive while on this medication.  Viral illnesses can last 7-14 days. Please follow up with your PCP if your symptoms are not improving. Please go to the ER for any worsening symptoms. This includes but is not limited to fever you can not control with tylenol or ibuprofen, you are not able to stay hydrated, you have shortness of breath or chest pain.  Thank you for choosing [facility] for your healthcare needs. I hope you feel better soon!

## 2023-10-16 NOTE — ED Provider Notes (Signed)
[facility]    CSN: [id number] Arrival date & time: 10/16/23  0950      History   Chief Complaint Chief Complaint  Patient presents with   Sore Throat    HPI Tamara Rodriguez is a 28 y.o. female  presents for evaluation of URI symptoms for 5 days. Patient reports associated symptoms of cough, congestion, body aches, sore throat, chills. Denies N/V/D, fevers, ear pain, shortness of breath. Patient does not have a hx of asthma. Patient is not an active smoker.   Reports no known sick contacts.  Pt has taken NyQuil OTC for symptoms. Pt has no other concerns at this time.    Sore Throat    Past Medical History:  Diagnosis Date   Anxiety    Depression     Patient Active Problem List   Diagnosis Date Noted   LGSIL on Pap smear of cervix 12/02/2020   MDD (major depressive disorder), recurrent episode, moderate (HCC)    Depression 03/18/2018   Chlamydia 11/29/2015   Irregular menses 11/26/2015   Encounter for Depo-Provera contraception 11/26/2015   Right ankle sprain 05/16/2011    History reviewed. No pertinent surgical history.  OB History     Gravida  0   Para  0   Term  0   Preterm  0   AB  0   Living  0      SAB  0   IAB  0   Ectopic  0   Multiple  0   Live Births  0            Home Medications    Prior to Admission medications   Medication Sig Start Date End Date Taking? Authorizing Provider  promethazine-dextromethorphan (PROMETHAZINE-DM) 6.25-15 MG/5ML syrup Take 5 mLs by mouth 4 (four) times daily as needed for cough. 10/16/23  Yes Radford Pax, NP  benzonatate (TESSALON) 100 MG capsule Take 1 capsule (100 mg total) by mouth every 8 (eight) hours. 04/09/22   Tegeler, Canary Brim, MD  celecoxib (CELEBREX) 200 MG capsule Take 1 capsule (200 mg total) by mouth 2 (two) times daily. 02/27/23   Glyn Ade, MD  ciprofloxacin (CIPRO) 500 MG tablet Take 1 tablet (500 mg total) by mouth 2 (two) times daily. 03/28/22   Wallis Bamberg,  PA-C  cyclobenzaprine (FLEXERIL) 10 MG tablet Take 1 tablet (10 mg total) by mouth 2 (two) times daily as needed for muscle spasms. 03/16/23   Bess Kinds, MD  hydrOXYzine (ATARAX/VISTARIL) 25 MG tablet Take 1 tablet (25 mg total) by mouth every 6 (six) hours as needed for anxiety. Patient not taking: Reported on 03/28/2022 04/07/21   Wieters, Hallie C, PA-C  ibuprofen (ADVIL) 800 MG tablet Take 1 tablet (800 mg total) by mouth every 8 (eight) hours as needed. 04/09/23   McDonald, Rachelle Hora, DPM  loperamide (IMODIUM) 2 MG capsule Take 1 capsule (2 mg total) by mouth 2 (two) times daily as needed for diarrhea or loose stools. 03/28/22   Wallis Bamberg, PA-C  naproxen (NAPROSYN) 500 MG tablet Take 1 tablet (500 mg total) by mouth 2 (two) times daily. 11/13/22   Radford Pax, NP  ondansetron (ZOFRAN) 4 MG tablet Take 1 tablet (4 mg total) by mouth every 8 (eight) hours as needed for nausea or vomiting. 04/09/22   Tegeler, Canary Brim, MD  ondansetron (ZOFRAN-ODT) 8 MG disintegrating tablet Take 1 tablet (8 mg total) by mouth every 8 (eight) hours as needed for nausea or  vomiting. 03/28/22   Wallis Bamberg, PA-C  polyethylene glycol (MIRALAX / GLYCOLAX) 17 g packet Take 17 g by mouth daily. Patient not taking: Reported on 03/28/2022 02/05/22   Cecil Cobbs, PA-C  valACYclovir (VALTREX) 1000 MG tablet Take 1 tablet (1,000 mg total) by mouth 2 (two) times daily. Patient not taking: Reported on 03/28/2022 05/11/21   Particia Nearing, PA-C    Family History Family History  Problem Relation Age of Onset   Diabetes Father    Hypertension Father    Asthma Mother    Hypertension Maternal Aunt    Cancer Maternal Aunt    Hypertension Maternal Uncle    Diabetes Paternal Aunt    Diabetes Paternal Uncle    Hypertension Maternal Grandmother    Cancer Maternal Grandmother    Hypertension Paternal Grandmother    Heart attack Neg Hx    Hyperlipidemia Neg Hx    Sudden death Neg Hx     Social History Social  History   Tobacco Use   Smoking status: Never   Smokeless tobacco: Never  Vaping Use   Vaping status: Never Used  Substance Use Topics   Alcohol use: Not Currently   Drug use: No     Allergies   Latex   Review of Systems Review of Systems  Constitutional:  Positive for chills.  HENT:  Positive for congestion and sore throat.   Respiratory:  Positive for cough.   Musculoskeletal:  Positive for myalgias.     Physical Exam Triage Vital Signs ED Triage Vitals  Encounter Vitals Group     BP 10/16/23 1050 93/64     Systolic BP Percentile --      Diastolic BP Percentile --      Pulse Rate 10/16/23 1050 86     Resp 10/16/23 1050 16     Temp 10/16/23 1050 97.9 F (36.6 C)     Temp Source 10/16/23 1050 Oral     SpO2 10/16/23 1050 100 %     Weight --      Height --      Head Circumference --      Peak Flow --      Pain Score 10/16/23 1048 0     Pain Loc --      Pain Education --      Exclude from Growth Chart --    No data found.  Updated Vital Signs BP 93/64 (BP Location: Right Arm)   Pulse 86   Temp 97.9 F (36.6 C) (Oral)   Resp 16   SpO2 100%   Visual Acuity Right Eye Distance:   Left Eye Distance:   Bilateral Distance:    Right Eye Near:   Left Eye Near:    Bilateral Near:     Physical Exam Vitals and nursing note reviewed.  Constitutional:      General: She is not in acute distress.    Appearance: She is well-developed. She is not ill-appearing.  HENT:     Head: Normocephalic and atraumatic.     Right Ear: Tympanic membrane and ear canal normal.     Left Ear: Tympanic membrane and ear canal normal.     Nose: Congestion present.     Mouth/Throat:     Mouth: Mucous membranes are moist.     Pharynx: Oropharynx is clear. Uvula midline. Posterior oropharyngeal erythema present.     Tonsils: No tonsillar exudate or tonsillar abscesses.  Eyes:     Conjunctiva/sclera: Conjunctivae normal.  Pupils: Pupils are equal, round, and reactive to light.   Cardiovascular:     Rate and Rhythm: Normal rate and regular rhythm.     Heart sounds: Normal heart sounds.  Pulmonary:     Effort: Pulmonary effort is normal.     Breath sounds: Normal breath sounds.  Musculoskeletal:     Cervical back: Normal range of motion and neck supple.  Lymphadenopathy:     Cervical: No cervical adenopathy.  Skin:    General: Skin is warm and dry.  Neurological:     General: No focal deficit present.     Mental Status: She is alert and oriented to person, place, and time.  Psychiatric:        Mood and Affect: Mood normal.        Behavior: Behavior normal.      UC Treatments / Results  Labs (all labs ordered are listed, but only abnormal results are displayed) Labs Reviewed  POCT INFLUENZA A/B    EKG   Radiology No results found.  Procedures Procedures (including critical care time)  Medications Ordered in UC Medications - No data to display  Initial Impression / Assessment and Plan / UC Course  I have reviewed the triage vital signs and the nursing notes.  Pertinent labs & imaging results that were available during my care of the patient were reviewed by me and considered in my medical decision making (see chart for details).     Reviewed exam and symptoms with patient.  No red flags.  Negative rapid flu, discussed viral illness and symptomatic treatment.  Promethazine DM as needed for cough, side effect profile reviewed.  PCP follow-up as symptoms do not improve.  ER precautions reviewed and patient verbalized understanding. Final Clinical Impressions(s) / UC Diagnoses   Final diagnoses:  Viral illness     Discharge Instructions      Please treat your symptoms with over the counter  tylenol or ibuprofen, humidifier, and rest.  Promethazine DM as needed for your cough.  Please note this medication can make you drowsy.  Do not drink alcohol or drive while on this medication.  Viral illnesses can last 7-14 days. Please follow up with  your PCP if your symptoms are not improving. Please go to the ER for any worsening symptoms. This includes but is not limited to fever you can not control with tylenol or ibuprofen, you are not able to stay hydrated, you have shortness of breath or chest pain.  Thank you for choosing [facility] for your healthcare needs. I hope you feel better soon!      ED Prescriptions     Medication Sig Dispense Auth. Provider   promethazine-dextromethorphan (PROMETHAZINE-DM) 6.25-15 MG/5ML syrup Take 5 mLs by mouth 4 (four) times daily as needed for cough. 118 mL Radford Pax, NP      PDMP not reviewed this encounter.   Radford Pax, NP 10/16/23 1128

## 2023-10-16 NOTE — ED Triage Notes (Signed)
Pt presents to UC for c/o sore throat, congestion, chills, body aches x5 days. Took nyquil

## 2023-12-06 ENCOUNTER — Ambulatory Visit (INDEPENDENT_AMBULATORY_CARE_PROVIDER_SITE_OTHER): Admitting: Podiatry

## 2023-12-06 DIAGNOSIS — Z91199 Patient's noncompliance with other medical treatment and regimen due to unspecified reason: Secondary | ICD-10-CM

## 2023-12-09 ENCOUNTER — Encounter: Payer: Self-pay | Admitting: Podiatry

## 2023-12-09 NOTE — Progress Notes (Signed)
 Patient was no-show for appointment today

## 2023-12-20 ENCOUNTER — Encounter: Payer: Self-pay | Admitting: Podiatry

## 2023-12-20 ENCOUNTER — Ambulatory Visit (INDEPENDENT_AMBULATORY_CARE_PROVIDER_SITE_OTHER): Admitting: Podiatry

## 2023-12-20 VITALS — Ht 60.0 in | Wt 112.0 lb

## 2023-12-20 DIAGNOSIS — M21621 Bunionette of right foot: Secondary | ICD-10-CM

## 2023-12-20 NOTE — Progress Notes (Signed)
  Subjective:  Patient ID: Tamara Rodriguez, female    DOB: June 10, 1996,  MRN: [id number]  Chief Complaint  Patient presents with   Foot Pain    Pt is here to f/u on discussing possible surgery on right foot due to tailors bunion.    28 y.o. female presents with the above complaint. History confirmed with patient.  She returns for follow-up today notes it is still painful over the fifth metatarsal, has not had any relief despite changes in shoes padding and OTC and Rx NSAIDs  Objective:  Physical Exam: warm, good capillary refill, no trophic changes or ulcerative lesions, normal DP and PT pulses, normal sensory exam, and pain over fifth metatarsal head with tailor's bunion deformity   Radiographs: Multiple views x-ray of the right foot: No evidence of fracture or stress fracture, she has tailor's bunion deformity, lateral IM angle 12 degrees Assessment:   1. Tailor's bunion of right foot       Plan:  Patient was evaluated and treated and all questions answered.  I again reviewed her radiographs and discussed her progress with her she has had unsuccessful relief with nonoperative treatment.  She is interested in proceeding with operative treatment.  We discussed correction of her tailor's bunion deformity with the fifth metatarsal osteotomy with percutaneous pin fixation.  We discussed the risk benefits and potential complications including but not limited to  pain, swelling, infection, scar, numbness which may be temporary or permanent, chronic pain, stiffness, nerve pain or damage, wound healing problems, bone healing problems including delayed or non-union.  All questions were addressed.  Informed consent signed and reviewed.  Outpatient surgery will be scheduled at a mutually agreeable date.   Surgical plan:  Procedure: - Tailor bunion correction with right fifth metatarsal osteotomy  Location: - [facility]  Anesthesia plan: - Sedation with local  Postoperative pain plan: -  Tylenol  1000 mg every 6 hours, ibuprofen  600 mg every 6 hours, gabapentin 300 mg every 8 hours x5 days, oxycodone 5 mg 1-2 tabs every 6 hours only as needed  DVT prophylaxis: - None required  WB Restrictions / DME needs: - WBAT in CAM boot  No follow-ups on file.

## 2023-12-24 ENCOUNTER — Emergency Department (HOSPITAL_COMMUNITY): Admission: EM | Admit: 2023-12-24 | Discharge: 2023-12-24 | Disposition: A | Discharge status: Home / Self Care

## 2023-12-24 DIAGNOSIS — J069 Acute upper respiratory infection, unspecified: Secondary | ICD-10-CM | POA: Insufficient documentation

## 2023-12-24 DIAGNOSIS — Z91040 Latex allergy status: Secondary | ICD-10-CM | POA: Diagnosis not present

## 2023-12-24 DIAGNOSIS — R509 Fever, unspecified: Secondary | ICD-10-CM | POA: Diagnosis present

## 2023-12-24 LAB — RESP PANEL BY RT-PCR (RSV, FLU A&B, COVID)  RVPGX2
Influenza A by PCR: NEGATIVE
Influenza B by PCR: NEGATIVE
Resp Syncytial Virus by PCR: NEGATIVE
SARS Coronavirus 2 by RT PCR: NEGATIVE

## 2023-12-24 LAB — GROUP A STREP BY PCR: Group A Strep by PCR: NOT DETECTED

## 2023-12-24 MED ORDER — KETOROLAC TROMETHAMINE 15 MG/ML IJ SOLN
15.0000 mg | Freq: Once | INTRAMUSCULAR | Status: AC
  Administered 2023-12-24: 15 mg via INTRAMUSCULAR
  Filled 2023-12-24: qty 1

## 2023-12-24 NOTE — Discharge Instructions (Signed)

## 2023-12-24 NOTE — ED Triage Notes (Signed)
 Pt arrived reporting headache sore throat, chills that started 1 day ago. Denies known exposure to illness.

## 2023-12-24 NOTE — ED Provider Notes (Signed)
 [place] EMERGENCY DEPARTMENT AT [facility] Provider Note   CSN: [id number] Arrival date & time: 12/24/23  1743     History  Chief Complaint  Patient presents with   Headache   Sore Throat   Fever   Chills    Tamara Rodriguez is a 28 y.o. female with PMHx anxiety, depression who presents to ED concerned for headache, sore throat, chills since yesterday. Patient has not taken any medication for her symptoms today. Denies cough, nausea, vomiting, diarrhea.   Headache Sore Throat Associated symptoms include headaches.  Fever Associated symptoms: headaches        Home Medications Prior to Admission medications   Medication Sig Start Date End Date Taking? Authorizing Provider  benzonatate  (TESSALON ) 100 MG capsule Take 1 capsule (100 mg total) by mouth every 8 (eight) hours. 04/09/22   Tegeler, Marine Sia, MD  celecoxib  (CELEBREX ) 200 MG capsule Take 1 capsule (200 mg total) by mouth 2 (two) times daily. 02/27/23   Onetha Bile, MD  ciprofloxacin  (CIPRO ) 500 MG tablet Take 1 tablet (500 mg total) by mouth 2 (two) times daily. 03/28/22   Adolph Hoop, PA-C  cyclobenzaprine  (FLEXERIL ) 10 MG tablet Take 1 tablet (10 mg total) by mouth 2 (two) times daily as needed for muscle spasms. 03/16/23   Wilhemena Harbour, MD  hydrOXYzine  (ATARAX /VISTARIL ) 25 MG tablet Take 1 tablet (25 mg total) by mouth every 6 (six) hours as needed for anxiety. Patient not taking: Reported on 03/28/2022 04/07/21   Wieters, Hallie C, PA-C  ibuprofen  (ADVIL ) 800 MG tablet Take 1 tablet (800 mg total) by mouth every 8 (eight) hours as needed. 04/09/23   McDonald, Olive Better, DPM  loperamide  (IMODIUM ) 2 MG capsule Take 1 capsule (2 mg total) by mouth 2 (two) times daily as needed for diarrhea or loose stools. 03/28/22   Adolph Hoop, PA-C  naproxen  (NAPROSYN ) 500 MG tablet Take 1 tablet (500 mg total) by mouth 2 (two) times daily. 11/13/22   Mayer, Jodi R, NP  ondansetron  (ZOFRAN ) 4 MG tablet Take 1 tablet (4 mg  total) by mouth every 8 (eight) hours as needed for nausea or vomiting. 04/09/22   Tegeler, Marine Sia, MD  ondansetron  (ZOFRAN -ODT) 8 MG disintegrating tablet Take 1 tablet (8 mg total) by mouth every 8 (eight) hours as needed for nausea or vomiting. 03/28/22   Adolph Hoop, PA-C  polyethylene glycol (MIRALAX  / GLYCOLAX ) 17 g packet Take 17 g by mouth daily. Patient not taking: Reported on 03/28/2022 02/05/22   Albertus Alt, PA-C  promethazine -dextromethorphan (PROMETHAZINE -DM) 6.25-15 MG/5ML syrup Take 5 mLs by mouth 4 (four) times daily as needed for cough. 10/16/23   Mayer, Jodi R, NP  valACYclovir  (VALTREX ) 1000 MG tablet Take 1 tablet (1,000 mg total) by mouth 2 (two) times daily. Patient not taking: Reported on 03/28/2022 05/11/21   Corbin Dess, PA-C      Allergies    Latex    Review of Systems   Review of Systems  Neurological:  Positive for headaches.    Physical Exam Updated Vital Signs BP (!) 94/59   Pulse 91   Temp 98.5 F (36.9 C) (Oral)   Resp 17   Ht 5' (1.524 m)   Wt 50.7 kg   SpO2 100%   BMI 21.83 kg/m  Physical Exam Vitals and nursing note reviewed.  Constitutional:      General: She is not in acute distress.    Appearance: She is not ill-appearing or toxic-appearing.  HENT:     Head: Normocephalic and atraumatic.     Mouth/Throat:     Mouth: Mucous membranes are moist.     Pharynx: No oropharyngeal exudate or posterior oropharyngeal erythema.     Comments: No tonsillar erythema or exudates. +2 tonsil BL. Clear oropharynx without lesions. Eyes:     General: No scleral icterus.       Right eye: No discharge.        Left eye: No discharge.     Conjunctiva/sclera: Conjunctivae normal.  Cardiovascular:     Rate and Rhythm: Normal rate and regular rhythm.     Pulses: Normal pulses.     Heart sounds: Normal heart sounds. No murmur heard. Pulmonary:     Effort: Pulmonary effort is normal. No respiratory distress.     Breath sounds: Normal breath  sounds. No wheezing, rhonchi or rales.  Musculoskeletal:     Right lower leg: No edema.     Left lower leg: No edema.  Skin:    General: Skin is warm and dry.     Findings: No rash.  Neurological:     General: No focal deficit present.     Mental Status: She is alert and oriented to person, place, and time. Mental status is at baseline.     Comments: GCS 15. Speech is goal oriented. No deficits appreciated to CN III-XII. Patient moves extremities without ataxia.   Psychiatric:        Mood and Affect: Mood normal.        Behavior: Behavior normal.     ED Results / Procedures / Treatments   Labs (all labs ordered are listed, but only abnormal results are displayed) Labs Reviewed  GROUP A STREP BY PCR  RESP PANEL BY RT-PCR (RSV, FLU A&B, COVID)  [id number]    EKG None  Radiology No results found.  Procedures Procedures    Medications Ordered in ED Medications  ketorolac (TORADOL) 15 MG/ML injection 15 mg (15 mg Intramuscular Given 12/24/23 1940)    ED Course/ Medical Decision Making/ A&P                                 Medical Decision Making Risk Prescription drug management.    This patient presents to the ED for concern of sore throat, headache, chills, this involves an extensive number of treatment options, and is a complaint that carries with it a high risk of complications and morbidity.  The differential diagnosis includes Flu/COVID/RSV, strep pharyngitis, sinusitis, peritonsillar abscess, retropharyngeal abscess, pneumonia, meningitis.   Co morbidities that complicate the patient evaluation  none   Additional history obtained:  No PCP listed in chart.  Will refer to community clinic.   Problem List / ED Course / Critical interventions / Medication management  Patient presents ED concern for headache, sore throat, chills x1 day.  Physical exam reassuring.  Patient initially mildly tachycardic and febrile at 101 bpm and 101.6 F.  This resolved after a  dose of Toradol. Patient's headache also feeling better after Toradol. Patient tolerating PO well. I Ordered, and personally interpreted labs.  Respiratory panel negative.  Strep PCR negative. It appears that patient is suffering with viral URI. Will provide work note.  Patient was educated on alternating between 650 mg Tylenol  and 400 mg ibuprofen  every 3 hours as needed for symptoms. Recommended following up with PCP. I have reviewed the patients home medicines and have made  adjustments as needed The patient has been appropriately medically screened and/or stabilized in the ED. I have low suspicion for any other emergent medical condition which would require further screening, evaluation or treatment in the ED or require inpatient management. At time of discharge the patient is hemodynamically stable and in no acute distress. I have discussed work-up results and diagnosis with patient and answered all questions. Patient is agreeable with discharge plan. We discussed strict return precautions for returning to the emergency department and they verbalized understanding.     Social Determinants of Health:  none         Final Clinical Impression(s) / ED Diagnoses Final diagnoses:  Viral URI    Rx / DC Orders ED Discharge Orders     None         [place], [place] 12/24/23 2148    Rolinda Climes, DO 12/24/23 2316

## 2024-01-24 ENCOUNTER — Telehealth: Payer: Self-pay | Admitting: Podiatry

## 2024-01-24 NOTE — Telephone Encounter (Signed)
 DOS: 03/07/24  (RT) METATARSAL OSTEOTOMY -16109     EFFECTIVE DATE :  04/29/2023     PER THE [employer] LOOK UP TOOL NO PRIOR AUTH IS REQ FOR CPT CODE 60454

## 2024-02-18 ENCOUNTER — Telehealth: Payer: Self-pay | Admitting: Podiatry

## 2024-02-18 DIAGNOSIS — Z0271 Encounter for disability determination: Secondary | ICD-10-CM

## 2024-02-18 NOTE — Telephone Encounter (Signed)
 s/w pt to adv [employer] forms were recd. Last day at work 03/06/24 RTW approx 03/17/24.  Will fax [phone]

## 2024-03-07 ENCOUNTER — Other Ambulatory Visit: Payer: Self-pay | Admitting: Podiatry

## 2024-03-07 DIAGNOSIS — M21611 Bunion of right foot: Secondary | ICD-10-CM | POA: Diagnosis not present

## 2024-03-07 DIAGNOSIS — M21621 Bunionette of right foot: Secondary | ICD-10-CM | POA: Diagnosis not present

## 2024-03-07 MED ORDER — IBUPROFEN 800 MG PO TABS: 800.0000 mg | ORAL_TABLET | Freq: Three times a day (TID) | ORAL | 0 refills | Status: AC | PRN

## 2024-03-07 MED ORDER — OXYCODONE HCL 5 MG PO TABS: 5.0000 mg | ORAL_TABLET | Freq: Three times a day (TID) | ORAL | 0 refills | Status: AC | PRN

## 2024-03-07 MED ORDER — ACETAMINOPHEN 500 MG PO TABS: 1000.0000 mg | ORAL_TABLET | Freq: Four times a day (QID) | ORAL | 0 refills | Status: AC | PRN

## 2024-03-13 ENCOUNTER — Encounter: Payer: Self-pay | Admitting: Podiatry

## 2024-03-13 ENCOUNTER — Ambulatory Visit (INDEPENDENT_AMBULATORY_CARE_PROVIDER_SITE_OTHER)

## 2024-03-13 ENCOUNTER — Ambulatory Visit (INDEPENDENT_AMBULATORY_CARE_PROVIDER_SITE_OTHER): Admitting: Podiatry

## 2024-03-13 VITALS — Ht 60.0 in | Wt 111.8 lb

## 2024-03-13 DIAGNOSIS — M21621 Bunionette of right foot: Secondary | ICD-10-CM | POA: Diagnosis not present

## 2024-03-13 NOTE — Progress Notes (Signed)
 Subjective:  Patient ID: Tamara Rodriguez, female    DOB: 06-Oct-1995,  MRN: [id number]  Chief Complaint  Patient presents with   Routine Post Op    POV #1 DOS 03/07/24 RT TAILORS BUNION CORRECTION) MCDONALD PT Pt states everything is going well had some pain this morning and took an ibuprofen , continues to wear cam boot at all time, incisions looks great, no drainage.    DOS: 03/07/2024 Procedure: Right foot tailor's bunion correction with Dr. Silva  28 y.o. female returns for post-op check.  She is doing well.  She states that her pain has been well-controlled.  She has been taking ibuprofen  intermittently.  She reports good compliance with the cam boot.  Review of Systems: Negative except as noted in the HPI. Denies N/V/F/Ch.  Past Medical History:  Diagnosis Date   Anxiety    Depression     Current Outpatient Medications:    acetaminophen  (TYLENOL ) 500 MG tablet, Take 2 tablets (1,000 mg total) by mouth every 6 (six) hours as needed for up to 14 days (pain)., Disp: 112 tablet, Rfl: 0   benzonatate  (TESSALON ) 100 MG capsule, Take 1 capsule (100 mg total) by mouth every 8 (eight) hours., Disp: 21 capsule, Rfl: 0   celecoxib  (CELEBREX ) 200 MG capsule, Take 1 capsule (200 mg total) by mouth 2 (two) times daily., Disp: 20 capsule, Rfl: 0   ciprofloxacin  (CIPRO ) 500 MG tablet, Take 1 tablet (500 mg total) by mouth 2 (two) times daily., Disp: 10 tablet, Rfl: 0   cyclobenzaprine  (FLEXERIL ) 10 MG tablet, Take 1 tablet (10 mg total) by mouth 2 (two) times daily as needed for muscle spasms., Disp: 8 tablet, Rfl: 0   ibuprofen  (IBU) 800 MG tablet, Take 1 tablet (800 mg total) by mouth every 8 (eight) hours as needed for up to 20 days., Disp: 60 tablet, Rfl: 0   loperamide  (IMODIUM ) 2 MG capsule, Take 1 capsule (2 mg total) by mouth 2 (two) times daily as needed for diarrhea or loose stools., Disp: 14 capsule, Rfl: 0   naproxen  (NAPROSYN ) 500 MG tablet, Take 1 tablet (500 mg total) by mouth 2  (two) times daily., Disp: 10 tablet, Rfl: 0   ondansetron  (ZOFRAN ) 4 MG tablet, Take 1 tablet (4 mg total) by mouth every 8 (eight) hours as needed for nausea or vomiting., Disp: 12 tablet, Rfl: 0   ondansetron  (ZOFRAN -ODT) 8 MG disintegrating tablet, Take 1 tablet (8 mg total) by mouth every 8 (eight) hours as needed for nausea or vomiting., Disp: 20 tablet, Rfl: 0   promethazine -dextromethorphan (PROMETHAZINE -DM) 6.25-15 MG/5ML syrup, Take 5 mLs by mouth 4 (four) times daily as needed for cough., Disp: 118 mL, Rfl: 0   hydrOXYzine  (ATARAX /VISTARIL ) 25 MG tablet, Take 1 tablet (25 mg total) by mouth every 6 (six) hours as needed for anxiety. (Patient not taking: Reported on 03/13/2024), Disp: 12 tablet, Rfl: 0   polyethylene glycol (MIRALAX  / GLYCOLAX ) 17 g packet, Take 17 g by mouth daily. (Patient not taking: Reported on 03/13/2024), Disp: 14 each, Rfl: 0   valACYclovir  (VALTREX ) 1000 MG tablet, Take 1 tablet (1,000 mg total) by mouth 2 (two) times daily. (Patient not taking: Reported on 03/13/2024), Disp: 14 tablet, Rfl: 1  Social History   Tobacco Use  Smoking Status Never  Smokeless Tobacco Never    Allergies  Allergen Reactions   Latex Hives   Objective:  There were no vitals filed for this visit. Body mass index is 21.83 kg/m. Constitutional Well developed.  Well nourished.  Vascular Foot warm and well perfused. Capillary refill normal to all digits.   Neurologic Normal speech. Oriented to person, place, and time. Epicritic sensation to light touch grossly present bilaterally.  Dermatologic Sutures intact to right dorsal fifth metatarsal neck region, skin edges well-approximated and well coapted.  Skin healing well without signs of infection.   Orthopedic: Tenderness to palpation noted about the surgical site. Dario is in place and maintained   Radiographs: Right foot 3 views weightbearing 03/13/2024 Postsurgical changes seen status post right foot tailor's bunion correction.   Position of fixation is maintained.  Alignment of the osteotomy is maintained with correction maintained. Assessment:   1. Tailor's bunion of right foot    Plan:  Patient was evaluated and treated and all questions answered.  S/p foot surgery right foot tailor's bunion correction -Progressing as expected post-operatively. -XR: Reviewed as described above -WB Status: Continue to weight-bear as tolerated in cam boot -Sutures: Left intact. -Medications: Has home medication available.  Recommend that she try and discontinue regular ibuprofen  use after the next week or so as this can delay bone healing. -Foot redressed.  Return for Post Op Suture Removal.

## 2024-03-19 ENCOUNTER — Telehealth: Payer: Self-pay | Admitting: Podiatry

## 2024-03-19 NOTE — Telephone Encounter (Signed)
 Patient having sharp pain in her pinky toe where incision was made for surgery. Wanting to know if this is normal or not, and what if anything she should do. Thanks!

## 2024-03-20 NOTE — Telephone Encounter (Signed)
 I tried reaching out to patient and no answer unable to leave message at this time.

## 2024-03-27 ENCOUNTER — Ambulatory Visit (INDEPENDENT_AMBULATORY_CARE_PROVIDER_SITE_OTHER): Admitting: Podiatry

## 2024-03-27 VITALS — Ht 60.0 in | Wt 111.0 lb

## 2024-03-27 DIAGNOSIS — M21621 Bunionette of right foot: Secondary | ICD-10-CM

## 2024-03-27 NOTE — Progress Notes (Signed)
  Subjective:  Patient ID: Tamara Rodriguez, female    DOB: 1995-08-29,  MRN: [id number]  Chief Complaint  Patient presents with   Post-op Follow-up    RM 20 POV #2 DOS 03/07/24 RT TAILORS BUNION CORRECTION) Baudelio Karnes PT. Patient states some numb feeling the fifth right toe and some minor pain in the right foot post surgery.    DOS: 03/07/2024 Procedure: Right tailor's bunionectomy with fifth metatarsal osteotomy  28 y.o. female returns for post-op check.   Review of Systems: Negative except as noted in the HPI. Denies N/V/F/Ch.   Objective:  There were no vitals filed for this visit. Body mass index is 21.68 kg/m. Constitutional Well developed. Well nourished.  Vascular Foot warm and well perfused. Capillary refill normal to all digits.  Calf is soft and supple, no posterior calf or knee pain, negative Homans' sign  Neurologic Normal speech. Oriented to person, place, and time. Epicritic sensation to light touch grossly present bilaterally.  Dermatologic Skin healing well without signs of infection. Skin edges well coapted without signs of infection.  Orthopedic: Tenderness to palpation noted about the surgical site.   Multiple view plain film radiographs: Good correction is noted with pin fixation Assessment:   1. Tailor's bunion of right foot    Plan:  Patient was evaluated and treated and all questions answered.  S/p foot surgery right -Progressing as expected post-operatively.  Sutures and pin removed uneventfully.  Bandage applied.  Continue weightbearing as tolerated in cam walker boot.  Return in 3 weeks for new radiographs   No follow-ups on file.

## 2024-04-17 ENCOUNTER — Encounter: Payer: Self-pay | Admitting: Podiatry

## 2024-04-17 ENCOUNTER — Ambulatory Visit (INDEPENDENT_AMBULATORY_CARE_PROVIDER_SITE_OTHER): Admitting: Podiatry

## 2024-04-17 ENCOUNTER — Ambulatory Visit (INDEPENDENT_AMBULATORY_CARE_PROVIDER_SITE_OTHER)

## 2024-04-17 VITALS — Ht 60.0 in | Wt 111.0 lb

## 2024-04-17 DIAGNOSIS — M21621 Bunionette of right foot: Secondary | ICD-10-CM | POA: Diagnosis not present

## 2024-04-17 NOTE — Progress Notes (Signed)
  Subjective:  Patient ID: Tamara Rodriguez, female    DOB: 03/09/1996,  MRN: [id number]  Chief Complaint  Patient presents with   Post-op Follow-up    Rm 5 Patient is here for post-op follow-up #3.Tamara Rodriguez Patient states intermittent soreness.     DOS: 03/07/2024 Procedure: Right tailor's bunionectomy with fifth metatarsal osteotomy  28 y.o. female returns for post-op check.   Review of Systems: Negative except as noted in the HPI. Denies N/V/F/Ch.   Objective:  There were no vitals filed for this visit. Body mass index is 21.68 kg/m. Constitutional Well developed. Well nourished.  Vascular Foot warm and well perfused. Capillary refill normal to all digits.  Calf is soft and supple, no posterior calf or knee pain, negative Homans' sign  Neurologic Normal speech. Oriented to person, place, and time. Epicritic sensation to light touch grossly present bilaterally.  Dermatologic Skin healing well without signs of infection. Skin edges well coapted without signs of infection.  Orthopedic: Tenderness to palpation noted about the surgical site.   Multiple view plain film radiographs: Good correction is noted with early consolidation developing Assessment:   1. Tailor's bunion of right foot    Plan:  Patient was evaluated and treated and all questions answered.  S/p foot surgery right - Doing well may gradually return to regular shoe gear that is supportive as tolerated.  Avoid barefoot activity and impact activity.  Follow-up with me in 6 weeks for final radiographs.   Return in about 6 weeks (around 05/29/2024) for post op (new x-rays).

## 2024-05-27 ENCOUNTER — Ambulatory Visit (INDEPENDENT_AMBULATORY_CARE_PROVIDER_SITE_OTHER)

## 2024-05-27 ENCOUNTER — Ambulatory Visit: Payer: Self-pay | Admitting: Podiatry

## 2024-05-27 VITALS — Ht 60.0 in | Wt 111.0 lb

## 2024-05-27 DIAGNOSIS — M21621 Bunionette of right foot: Secondary | ICD-10-CM | POA: Diagnosis not present

## 2024-05-27 NOTE — Progress Notes (Signed)
  Subjective:  Patient ID: Tamara Rodriguez, female    DOB: 03-Feb-1996,  MRN: [id number]  Chief Complaint  Patient presents with   Bunions    RM 1 Return in about 6 week for right foot tailor's bunion. Pt states some minor soreness at surgical site.    DOS: 03/07/2024 Procedure: Right tailor's bunionectomy with fifth metatarsal osteotomy  28 y.o. female returns for post-op check.   Review of Systems: Negative except as noted in the HPI. Denies N/V/F/Ch.   Objective:  There were no vitals filed for this visit. Body mass index is 21.68 kg/m. Constitutional Well developed. Well nourished.  Vascular Foot warm and well perfused. Capillary refill normal to all digits.  Calf is soft and supple, no posterior calf or knee pain, negative Homans' sign  Neurologic Normal speech. Oriented to person, place, and time. Epicritic sensation to light touch grossly present bilaterally.  Dermatologic Skin healing well without signs of infection. Skin edges well coapted without signs of infection.  Orthopedic: Little to no pain to palpation noted about the surgical site.   Multiple view plain film radiographs: Good correction is maintained with near complete bone consolidation Assessment:   1. Tailor's bunion of right foot    Plan:  Patient was evaluated and treated and all questions answered.  S/p foot surgery right - Doing very well.  Regular shoe gear as tolerated.  Gradual return to activity including impact activity if tolerated.  Discussed that the bone callus will continue to consolidate over the next 1 to 3 months.  Return as needed for this and or other issues   No follow-ups on file.

## 2024-07-21 ENCOUNTER — Emergency Department (HOSPITAL_BASED_OUTPATIENT_CLINIC_OR_DEPARTMENT_OTHER)
Admission: EM | Admit: 2024-07-21 | Discharge: 2024-07-21 | Disposition: A | Discharge status: Home / Self Care | Attending: Emergency Medicine | Admitting: Emergency Medicine

## 2024-07-21 ENCOUNTER — Encounter (HOSPITAL_BASED_OUTPATIENT_CLINIC_OR_DEPARTMENT_OTHER): Payer: Self-pay

## 2024-07-21 ENCOUNTER — Emergency Department (HOSPITAL_BASED_OUTPATIENT_CLINIC_OR_DEPARTMENT_OTHER): Admitting: Radiology

## 2024-07-21 ENCOUNTER — Other Ambulatory Visit: Payer: Self-pay

## 2024-07-21 DIAGNOSIS — J069 Acute upper respiratory infection, unspecified: Secondary | ICD-10-CM | POA: Insufficient documentation

## 2024-07-21 DIAGNOSIS — Z79899 Other long term (current) drug therapy: Secondary | ICD-10-CM | POA: Insufficient documentation

## 2024-07-21 DIAGNOSIS — J040 Acute laryngitis: Secondary | ICD-10-CM | POA: Insufficient documentation

## 2024-07-21 DIAGNOSIS — Z91040 Latex allergy status: Secondary | ICD-10-CM | POA: Diagnosis not present

## 2024-07-21 DIAGNOSIS — R000 Tachycardia, unspecified: Secondary | ICD-10-CM | POA: Insufficient documentation

## 2024-07-21 DIAGNOSIS — R059 Cough, unspecified: Secondary | ICD-10-CM | POA: Diagnosis present

## 2024-07-21 LAB — RESP PANEL BY RT-PCR (RSV, FLU A&B, COVID)  RVPGX2
Influenza A by PCR: NEGATIVE
Influenza B by PCR: NEGATIVE
Resp Syncytial Virus by PCR: NEGATIVE
SARS Coronavirus 2 by RT PCR: NEGATIVE

## 2024-07-21 MED ORDER — PREDNISONE 10 MG PO TABS: 10.0000 mg | ORAL_TABLET | Freq: Every day | ORAL | 0 refills | Status: AC

## 2024-07-21 NOTE — ED Triage Notes (Signed)
 Pt c/o cough since Mid-October, yesterday fever 101. States she went to work today but still has cough. 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

## 2024-07-21 NOTE — ED Provider Notes (Signed)
 [place] EMERGENCY DEPARTMENT AT [facility] Provider Note   CSN: [id number] Arrival date & time: 07/21/24  1505     Patient presents with: URI   Tamara Rodriguez is a 28 y.o. female.   Patient is here for evaluation of ongoing cough, sore throat, and hoarse voice.  Patient states she has had a cough since mid October.  She has had times where this cough seems to get better however over the last several days has gotten worse.  She had a fever of 101 yesterday.  She took NyQuil last night to sleep.  She has not taken any over-the-counter medications today.  She has not noted a fever today though she has felt chills.  She is afebrile at this time.  Patient works as a engineer, site for first graders.  She denies nausea, constipation, or diarrhea.  She did have 1 episode of vomiting yesterday morning which she attributes to not having eaten prior to taking Emergen-C supplement.  She reports some shortness of breath last week with talking for long periods.  No history of asthma.  The history is provided by the patient.  URI Presenting symptoms: cough        Prior to Admission medications   Medication Sig Start Date End Date Taking? Authorizing Provider  predniSONE  (DELTASONE ) 10 MG tablet Take 1 tablet (10 mg total) by mouth daily. 07/21/24  Yes Tamara Almarie LABOR, PA-C  benzonatate  (TESSALON ) 100 MG capsule Take 1 capsule (100 mg total) by mouth every 8 (eight) hours. 04/09/22   Tegeler, Lonni JINNY, MD  celecoxib  (CELEBREX ) 200 MG capsule Take 1 capsule (200 mg total) by mouth 2 (two) times daily. 02/27/23   Jerral Meth, MD  ciprofloxacin  (CIPRO ) 500 MG tablet Take 1 tablet (500 mg total) by mouth 2 (two) times daily. 03/28/22   Christopher Savannah, PA-C  cyclobenzaprine  (FLEXERIL ) 10 MG tablet Take 1 tablet (10 mg total) by mouth 2 (two) times daily as needed for muscle spasms. 03/16/23   Jennelle Riis, MD  hydrOXYzine  (ATARAX /VISTARIL ) 25 MG tablet Take 1 tablet (25 mg total) by  mouth every 6 (six) hours as needed for anxiety. 04/07/21   Wieters, Hallie C, PA-C  loperamide  (IMODIUM ) 2 MG capsule Take 1 capsule (2 mg total) by mouth 2 (two) times daily as needed for diarrhea or loose stools. 03/28/22   Christopher Savannah, PA-C  naproxen  (NAPROSYN ) 500 MG tablet Take 1 tablet (500 mg total) by mouth 2 (two) times daily. 11/13/22   Mayer, Jodi R, NP  ondansetron  (ZOFRAN ) 4 MG tablet Take 1 tablet (4 mg total) by mouth every 8 (eight) hours as needed for nausea or vomiting. 04/09/22   Tegeler, Lonni JINNY, MD  ondansetron  (ZOFRAN -ODT) 8 MG disintegrating tablet Take 1 tablet (8 mg total) by mouth every 8 (eight) hours as needed for nausea or vomiting. 03/28/22   Christopher Savannah, PA-C  polyethylene glycol (MIRALAX  / GLYCOLAX ) 17 g packet Take 17 g by mouth daily. 02/05/22   Renae Bernarda HERO, PA-C  promethazine -dextromethorphan (PROMETHAZINE -DM) 6.25-15 MG/5ML syrup Take 5 mLs by mouth 4 (four) times daily as needed for cough. 10/16/23   Mayer, Jodi R, NP  valACYclovir  (VALTREX ) 1000 MG tablet Take 1 tablet (1,000 mg total) by mouth 2 (two) times daily. 05/11/21   Stuart Vernell Almarie, PA-C    Allergies: Latex    Review of Systems  Respiratory:  Positive for cough.     Updated Vital Signs BP 116/78 (BP Location: Right Arm)  Pulse 91   Temp 98.8 F (37.1 C)   Resp 16   SpO2 100%   Physical Exam Vitals and nursing note reviewed.  Constitutional:      General: She is not in acute distress.    Appearance: Normal appearance. She is normal weight. She is not ill-appearing, toxic-appearing or diaphoretic.     Comments: Hoarse voice  HENT:     Head: Normocephalic and atraumatic.     Right Ear: Tympanic membrane, ear canal and external ear normal.     Left Ear: Tympanic membrane, ear canal and external ear normal.     Nose: Nose normal.     Mouth/Throat:     Mouth: Mucous membranes are moist.     Pharynx: Posterior oropharyngeal erythema present. No oropharyngeal exudate.  Eyes:      General: No scleral icterus.    Extraocular Movements: Extraocular movements intact.     Conjunctiva/sclera: Conjunctivae normal.     Pupils: Pupils are equal, round, and reactive to light.  Cardiovascular:     Rate and Rhythm: Regular rhythm. Tachycardia present.     Pulses: Normal pulses.     Heart sounds: Normal heart sounds.  Pulmonary:     Effort: Pulmonary effort is normal. No respiratory distress.     Breath sounds: No stridor. Wheezing (Left lower lung base, very faint.) present. No rhonchi or rales.  Skin:    General: Skin is warm and dry.     Coloration: Skin is not jaundiced or pale.  Neurological:     Mental Status: She is alert and oriented to person, place, and time.     (all labs ordered are listed, but only abnormal results are displayed) Labs Reviewed  RESP PANEL BY RT-PCR (RSV, FLU A&B, COVID)  [id number]    EKG: None  Radiology: DG Chest 2 View Result Date: 07/21/2024 EXAM: 2 VIEW(S) XRAY OF THE CHEST 07/21/2024 05:23:00 PM COMPARISON: 10/03/2009 CLINICAL HISTORY: shortness of breath FINDINGS: LUNGS AND PLEURA: No focal pulmonary opacity. No pleural effusion. No pneumothorax. HEART AND MEDIASTINUM: No acute abnormality of the cardiac and mediastinal silhouettes. BONES AND SOFT TISSUES: No acute fracture or destructive lesion. Dextroscoliosis of thoracic spine. IMPRESSION: 1. No acute cardiopulmonary abnormality. Electronically signed by: Luke Bun MD 07/21/2024 06:19 PM EST RP Workstation: [id number]     Procedures   Medications Ordered in the ED - No data to display   Patient presents to the ED for concern of ongoing cough, sore throat, hoarse voice, this involves an extensive number of treatment options, and is a complaint that carries with it a high risk of complications and morbidity.  The differential diagnosis includes viral URI, pneumonia, esophagitis, or GERD.  Based on symptoms, there is low suspicion for GERD or esophagitis.  No evidence of  pneumonia on x-ray.  Lab Tests:  I Ordered, and personally interpreted labs.  The pertinent results include: Respiratory panel negative   Imaging Studies ordered:  I ordered imaging studies including chest x-ray I independently visualized and interpreted imaging which showed no acute findings I agree with the radiologist interpretation   Problem List / ED Course:  Patient is here for evaluation of ongoing cough, sore throat, and laryngitis.  Imaging shows no acute findings and respiratory panel negative.  Symptoms likely sequelae of viral URI.   Reevaluation:  After the interventions noted above, I reevaluated the patient and found that they have :stayed the same   Dispostion:  After consideration of the diagnostic results and the patients  response to treatment, I feel that the patent would benefit from continued supportive care in the home setting.  Prescription for short course steroids sent to pharmacy to aid with resolution of laryngitis.  Encouraged her to follow-up with primary care for evaluation of resolution of symptoms and ongoing management.   Medical Decision Making Amount and/or Complexity of Data Reviewed Radiology: ordered.  Risk Prescription drug management.     Final diagnoses:  Laryngitis, acute  Viral URI with cough    ED Discharge Orders          Ordered    predniSONE  (DELTASONE ) 10 MG tablet  Daily        07/21/24 1900               Tamara Rodriguez 07/22/24 0046    Randol Simmonds, MD 07/25/24 [phone]

## 2024-07-21 NOTE — Discharge Instructions (Addendum)
 As we discussed your x ray imaging did not show signs of pneumonia. I have sent a short course of steroids to your pharmacy to help with current laryngitis. Please return if symptoms worsen or if any other concerning symptoms develop.

## 2024-07-29 ENCOUNTER — Telehealth: Admitting: Family Medicine

## 2024-07-29 ENCOUNTER — Encounter (HOSPITAL_COMMUNITY): Payer: Self-pay

## 2024-07-29 DIAGNOSIS — B3731 Acute candidiasis of vulva and vagina: Secondary | ICD-10-CM | POA: Diagnosis not present

## 2024-07-29 MED ORDER — FLUCONAZOLE 150 MG PO TABS: 150.0000 mg | ORAL_TABLET | ORAL | 0 refills | Status: AC

## 2024-07-29 NOTE — Progress Notes (Signed)
 Virtual Visit Consent   Tamara Rodriguez, you are scheduled for a virtual visit with a [facility] provider today. Just as with appointments in the office, your consent must be obtained to participate. Your consent will be active for this visit and any virtual visit you may have with one of our providers in the next 365 days. If you have a MyChart account, a copy of this consent can be sent to you electronically.  As this is a virtual visit, video technology does not allow for your provider to perform a traditional examination. This may limit your provider's ability to fully assess your condition. If your provider identifies any concerns that need to be evaluated in person or the need to arrange testing (such as labs, EKG, etc.), we will make arrangements to do so. Although advances in technology are sophisticated, we cannot ensure that it will always work on either your end or our end. If the connection with a video visit is poor, the visit may have to be switched to a telephone visit. With either a video or telephone visit, we are not always able to ensure that we have a secure connection.  By engaging in this virtual visit, you consent to the provision of healthcare and authorize for your insurance to be billed (if applicable) for the services provided during this visit. Depending on your insurance coverage, you may receive a charge related to this service.  I need to obtain your verbal consent now. Are you willing to proceed with your visit today? Tamara Rodriguez has provided verbal consent on 07/29/2024 for a virtual visit (video or telephone). Chiquita CHRISTELLA Barefoot, NP  Date: 07/29/2024 11:48 AM   Virtual Visit via Video Note   I, Chiquita CHRISTELLA Barefoot, connected with  Tamara Rodriguez  ([id number], 25-Jul-1996) on 07/29/24 at 11:45 AM EST by a video-enabled telemedicine application and verified that I am speaking with the correct person using two identifiers.  Location: Patient: Virtual Visit Location Patient:  Home Provider: Virtual Visit Location Provider: Home Office   I discussed the limitations of evaluation and management by telemedicine and the availability of in person appointments. The patient expressed understanding and agreed to proceed.    History of Present Illness: Tamara Rodriguez is a 28 y.o. who identifies as a female who was assigned female at birth, and is being seen today for vaginal itching and discomfort  Onset was 2 weeks ago- used monistat went away and then came back after intercourse and started again in last 2 days Associated symptoms are itching, white discharge, irritation  Modifying factors are monistat some improvement, using condoms with intercourse and started having it again  Denies pelvic pain, back pain, UTI symptoms fevers, chills    Problems:  Patient Active Problem List   Diagnosis Date Noted   LGSIL on Pap smear of cervix 12/02/2020   MDD (major depressive disorder), recurrent episode, moderate (HCC)    Depression 03/18/2018   Chlamydia 11/29/2015   Irregular menses 11/26/2015   Encounter for Depo-Provera  contraception 11/26/2015   Right ankle sprain 05/16/2011    Allergies:  Allergies  Allergen Reactions   Latex Hives   Medications:  Current Outpatient Medications:    benzonatate  (TESSALON ) 100 MG capsule, Take 1 capsule (100 mg total) by mouth every 8 (eight) hours., Disp: 21 capsule, Rfl: 0   celecoxib  (CELEBREX ) 200 MG capsule, Take 1 capsule (200 mg total) by mouth 2 (two) times daily., Disp: 20 capsule, Rfl: 0   ciprofloxacin  (  CIPRO ) 500 MG tablet, Take 1 tablet (500 mg total) by mouth 2 (two) times daily., Disp: 10 tablet, Rfl: 0   cyclobenzaprine  (FLEXERIL ) 10 MG tablet, Take 1 tablet (10 mg total) by mouth 2 (two) times daily as needed for muscle spasms., Disp: 8 tablet, Rfl: 0   hydrOXYzine  (ATARAX /VISTARIL ) 25 MG tablet, Take 1 tablet (25 mg total) by mouth every 6 (six) hours as needed for anxiety., Disp: 12 tablet, Rfl: 0   loperamide   (IMODIUM ) 2 MG capsule, Take 1 capsule (2 mg total) by mouth 2 (two) times daily as needed for diarrhea or loose stools., Disp: 14 capsule, Rfl: 0   naproxen  (NAPROSYN ) 500 MG tablet, Take 1 tablet (500 mg total) by mouth 2 (two) times daily., Disp: 10 tablet, Rfl: 0   ondansetron  (ZOFRAN ) 4 MG tablet, Take 1 tablet (4 mg total) by mouth every 8 (eight) hours as needed for nausea or vomiting., Disp: 12 tablet, Rfl: 0   ondansetron  (ZOFRAN -ODT) 8 MG disintegrating tablet, Take 1 tablet (8 mg total) by mouth every 8 (eight) hours as needed for nausea or vomiting., Disp: 20 tablet, Rfl: 0   polyethylene glycol (MIRALAX  / GLYCOLAX ) 17 g packet, Take 17 g by mouth daily., Disp: 14 each, Rfl: 0   predniSONE  (DELTASONE ) 10 MG tablet, Take 1 tablet (10 mg total) by mouth daily., Disp: 5 tablet, Rfl: 0   promethazine -dextromethorphan (PROMETHAZINE -DM) 6.25-15 MG/5ML syrup, Take 5 mLs by mouth 4 (four) times daily as needed for cough., Disp: 118 mL, Rfl: 0   valACYclovir  (VALTREX ) 1000 MG tablet, Take 1 tablet (1,000 mg total) by mouth 2 (two) times daily., Disp: 14 tablet, Rfl: 1  Observations/Objective: Patient is well-developed, well-nourished in no acute distress.  Resting comfortably  at home.  Head is normocephalic, atraumatic.  No labored breathing.  Speech is clear and coherent with logical content.  Patient is alert and oriented at baseline.    Assessment and Plan:  1. Yeast vaginitis (Primary)  - fluconazole  (DIFLUCAN ) 150 MG tablet; Take 1 tablet (150 mg total) by mouth as directed. Repeat in 3 days as needed  Dispense: 2 tablet; Refill: 0   -no other red flags for PID or SIT infection -increase fluids -complete medication as discussed -prevention discussed and on AVS   Reviewed side effects, risks and benefits of medication.    Patient acknowledged agreement and understanding of the plan.   Past Medical, Surgical, Social History, Allergies, and Medications have been  Reviewed.    Follow Up Instructions: I discussed the assessment and treatment plan with the patient. The patient was provided an opportunity to ask questions and all were answered. The patient agreed with the plan and demonstrated an understanding of the instructions.  A copy of instructions were sent to the patient via MyChart unless otherwise noted below.    The patient was advised to call back or seek an in-person evaluation if the symptoms worsen or if the condition fails to improve as anticipated.    Chiquita CHRISTELLA Barefoot, NP

## 2024-07-29 NOTE — Patient Instructions (Signed)
 Tamara Rodriguez, thank you for joining Tamara CHRISTELLA Barefoot, NP for today's virtual visit.  While this provider is not your primary care provider (PCP), if your PCP is located in our provider database this encounter information will be shared with them immediately following your visit.   A [place] [facility] MyChart account gives you access to today's visit and all your visits, tests, and labs performed at [facility]  click here if you don't have a [facility] MyChart account or go to [email or web address]  Consent: (Patient) Tamara Rodriguez provided verbal consent for this virtual visit at the beginning of the encounter.  Current Medications:  Current Outpatient Medications:    fluconazole  (DIFLUCAN ) 150 MG tablet, Take 1 tablet (150 mg total) by mouth as directed. Repeat in 3 days as needed, Disp: 2 tablet, Rfl: 0   benzonatate  (TESSALON ) 100 MG capsule, Take 1 capsule (100 mg total) by mouth every 8 (eight) hours., Disp: 21 capsule, Rfl: 0   celecoxib  (CELEBREX ) 200 MG capsule, Take 1 capsule (200 mg total) by mouth 2 (two) times daily., Disp: 20 capsule, Rfl: 0   ciprofloxacin  (CIPRO ) 500 MG tablet, Take 1 tablet (500 mg total) by mouth 2 (two) times daily., Disp: 10 tablet, Rfl: 0   cyclobenzaprine  (FLEXERIL ) 10 MG tablet, Take 1 tablet (10 mg total) by mouth 2 (two) times daily as needed for muscle spasms., Disp: 8 tablet, Rfl: 0   hydrOXYzine  (ATARAX /VISTARIL ) 25 MG tablet, Take 1 tablet (25 mg total) by mouth every 6 (six) hours as needed for anxiety., Disp: 12 tablet, Rfl: 0   loperamide  (IMODIUM ) 2 MG capsule, Take 1 capsule (2 mg total) by mouth 2 (two) times daily as needed for diarrhea or loose stools., Disp: 14 capsule, Rfl: 0   naproxen  (NAPROSYN ) 500 MG tablet, Take 1 tablet (500 mg total) by mouth 2 (two) times daily., Disp: 10 tablet, Rfl: 0   ondansetron  (ZOFRAN ) 4 MG tablet, Take 1 tablet (4 mg total) by mouth every 8 (eight) hours as needed for nausea or vomiting., Disp:  12 tablet, Rfl: 0   ondansetron  (ZOFRAN -ODT) 8 MG disintegrating tablet, Take 1 tablet (8 mg total) by mouth every 8 (eight) hours as needed for nausea or vomiting., Disp: 20 tablet, Rfl: 0   polyethylene glycol (MIRALAX  / GLYCOLAX ) 17 g packet, Take 17 g by mouth daily., Disp: 14 each, Rfl: 0   predniSONE  (DELTASONE ) 10 MG tablet, Take 1 tablet (10 mg total) by mouth daily., Disp: 5 tablet, Rfl: 0   promethazine -dextromethorphan (PROMETHAZINE -DM) 6.25-15 MG/5ML syrup, Take 5 mLs by mouth 4 (four) times daily as needed for cough., Disp: 118 mL, Rfl: 0   valACYclovir  (VALTREX ) 1000 MG tablet, Take 1 tablet (1,000 mg total) by mouth 2 (two) times daily., Disp: 14 tablet, Rfl: 1   Medications ordered in this encounter:  Meds ordered this encounter  Medications   fluconazole  (DIFLUCAN ) 150 MG tablet    Sig: Take 1 tablet (150 mg total) by mouth as directed. Repeat in 3 days as needed    Dispense:  2 tablet    Refill:  0    Supervising Provider:   LAMPTEY, PHILIP O [[id number]]     *If you need refills on other medications prior to your next appointment, please contact your pharmacy*  Follow-Up: Call back or seek an in-person evaluation if the symptoms worsen or if the condition fails to improve as anticipated.  [facility] [phone]  Other Instructions  Healthy vaginal hygiene  practices   -  Avoid sleeper pajamas. Nightgowns allow air to circulate.  Sleep without underpants whenever possible.  -  Wear cotton underpants during the day. Double-rinse underwear after washing to avoid residual irritants. Do not use fabric softeners for underwear and swimsuits.  - Avoid tights, leotards, leggings, skinny jeans, and other tight-fitting clothing. Skirts and loose-fitting pants allow air to circulate.  - Avoid pantyliners.  Instead use tampons or cotton pads.  - Use the restroom after intercourse to help prevent UTI's  - Daily warm bathing is helpful:     - Soak in clean  water  (no soap) for 10 to 15 minutes. Adding vinegar or baking soda to the water  has not been specifically studied and may not be better than clean water  alone.      - Use soap to wash regions other than the genital area just before getting out of the tub. Limit use of any soap on genital areas. Use fragance-free soaps.     - Rinse the genital area well and gently pat dry.  Don't rub.  Hair dryer to assist with drying can be used only if on cool setting.     - Do not use bubble baths or perfumed soaps.  - Do not use any feminine sprays, douches or powders.  These contain chemicals that will irritate the skin.  - If the genital area is tender or swollen, cool compresses may relieve the discomfort. Unscented wet wipes can be used instead of toilet paper for wiping.   - Emollients, such as Vaseline, may help protect skin and can be applied to the irritated area.  - Always remember to wipe front-to-back after bowel movements. Pat dry after urination.  - Do not sit in wet swimsuits for long periods of time after swimming     If you have been instructed to have an in-person evaluation today at a local Urgent Care facility, please use the link below. It will take you to a list of all of our available [facility], including address, phone number and hours of operation. Please do not delay care.  [facility]  If you or a family member do not have a primary care provider, use the link below to schedule a visit and establish care. When you choose a [facility] primary care physician or advanced practice provider, you gain a long-term partner in health. Find a Primary Care Provider  Learn more about [facility]'s in-office and virtual care options: [facility] - Get Care Now

## 2024-08-01 ENCOUNTER — Other Ambulatory Visit: Payer: Self-pay

## 2024-08-01 ENCOUNTER — Other Ambulatory Visit (HOSPITAL_COMMUNITY): Payer: Self-pay

## 2024-10-30 ENCOUNTER — Ambulatory Visit
